# Patient Record
Sex: Male | Born: 1950 | Race: White | Hispanic: No | Marital: Single | Smoking: Never smoker
Health system: Southern US, Community
[De-identification: ages and names within clinical notes are randomized; demographics above are authoritative.]

## PROBLEM LIST (undated history)

## (undated) DIAGNOSIS — L570 Actinic keratosis: Secondary | ICD-10-CM

## (undated) HISTORY — DX: Actinic keratosis: L57.0

---

## 2009-05-09 ENCOUNTER — Ambulatory Visit: Payer: Self-pay | Admitting: Gastroenterology

## 2014-06-28 ENCOUNTER — Ambulatory Visit
Admit: 2014-06-28 | Disposition: A | Discharge status: Home / Self Care | Payer: Self-pay | Attending: General Practice | Admitting: General Practice

## 2019-04-16 ENCOUNTER — Telehealth: Payer: Self-pay | Admitting: Family Medicine

## 2019-04-16 NOTE — Telephone Encounter (Signed)
Copied from [place] [id number]. Topic: Appointment Scheduling - New Patient >> Apr 16, 2019  9:27 AM Greggory Keen D wrote: Pt's daughter Sharyn Creamer called to schedule her dad an appt with Dr. Wynetta Emery.  She is Dr. Elta Guadeloupe Crissman's step daughter and she said her brother sees Dr. Wynetta Emery and wants to get her dad established with her.  CB#  [phone]

## 2019-04-16 NOTE — Telephone Encounter (Signed)
That's fine

## 2019-04-16 NOTE — Telephone Encounter (Signed)
Scheduled

## 2019-04-30 ENCOUNTER — Encounter: Payer: Self-pay | Admitting: Family Medicine

## 2019-04-30 ENCOUNTER — Ambulatory Visit (INDEPENDENT_AMBULATORY_CARE_PROVIDER_SITE_OTHER): Payer: Medicare Other | Admitting: Family Medicine

## 2019-04-30 DIAGNOSIS — Z1329 Encounter for screening for other suspected endocrine disorder: Secondary | ICD-10-CM

## 2019-04-30 DIAGNOSIS — Z114 Encounter for screening for human immunodeficiency virus [HIV]: Secondary | ICD-10-CM

## 2019-04-30 DIAGNOSIS — Z125 Encounter for screening for malignant neoplasm of prostate: Secondary | ICD-10-CM

## 2019-04-30 DIAGNOSIS — Z131 Encounter for screening for diabetes mellitus: Secondary | ICD-10-CM | POA: Diagnosis not present

## 2019-04-30 DIAGNOSIS — R5383 Other fatigue: Secondary | ICD-10-CM

## 2019-04-30 DIAGNOSIS — Z13220 Encounter for screening for lipoid disorders: Secondary | ICD-10-CM | POA: Diagnosis not present

## 2019-04-30 DIAGNOSIS — Z130 Encounter for screening for diseases of the blood and blood-forming organs and certain disorders involving the immune mechanism: Secondary | ICD-10-CM

## 2019-04-30 DIAGNOSIS — Z1159 Encounter for screening for other viral diseases: Secondary | ICD-10-CM

## 2019-04-30 LAB — UA/M W/RFLX CULTURE, ROUTINE
Bilirubin, UA: NEGATIVE
Glucose, UA: NEGATIVE
Ketones, UA: NEGATIVE
Leukocytes,UA: NEGATIVE
Nitrite, UA: NEGATIVE
Protein,UA: NEGATIVE
RBC, UA: NEGATIVE
Specific Gravity, UA: 1.02 (ref 1.005–1.030)
Urobilinogen, Ur: 0.2 mg/dL (ref 0.2–1.0)
pH, UA: 7 (ref 5.0–7.5)

## 2019-04-30 LAB — BAYER DCA HB A1C WAIVED: HB A1C (BAYER DCA - WAIVED): 5.5 % (ref <7.0)
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
## 2019-05-05 LAB — LIPID PANEL W/O CHOL/HDL RATIO
Cholesterol, Total: 201 mg/dL — ABNORMAL HIGH (ref 100–199)
HDL: 61 mg/dL (ref >39)
LDL Chol Calc (NIH): 127 mg/dL — ABNORMAL HIGH (ref 0–99)
Triglycerides: 74 mg/dL (ref 0–149)
VLDL Cholesterol Cal: 13 mg/dL (ref 5–40)

## 2019-05-05 LAB — COMPREHENSIVE METABOLIC PANEL
ALT: 13 IU/L (ref 0–44)
AST: 17 IU/L (ref 0–40)
Albumin/Globulin Ratio: 2.2 (ref 1.2–2.2)
Albumin: 4.8 g/dL (ref 3.8–4.8)
Alkaline Phosphatase: 76 IU/L (ref 39–117)
BUN/Creatinine Ratio: 13 (ref 10–24)
BUN: 14 mg/dL (ref 8–27)
Bilirubin Total: 0.9 mg/dL (ref 0.0–1.2)
CO2: 23 mmol/L (ref 20–29)
Calcium: 9.3 mg/dL (ref 8.6–10.2)
Chloride: 99 mmol/L (ref 96–106)
Creatinine, Ser: 1.1 mg/dL (ref 0.76–1.27)
GFR calc Af Amer: 78 mL/min/{1.73_m2} (ref >59)
GFR calc non Af Amer: 68 mL/min/{1.73_m2} (ref >59)
Globulin, Total: 2.2 g/dL (ref 1.5–4.5)
Glucose: 90 mg/dL (ref 65–99)
Potassium: 4.8 mmol/L (ref 3.5–5.2)
Sodium: 138 mmol/L (ref 134–144)
Total Protein: 7 g/dL (ref 6.0–8.5)

## 2019-05-05 LAB — TSH: TSH: 2.89 u[IU]/mL (ref 0.450–4.500)

## 2019-05-05 LAB — CBC WITH DIFFERENTIAL/PLATELET
Basophils Absolute: 0.1 10*3/uL (ref 0.0–0.2)
Basos: 1 %
EOS (ABSOLUTE): 0.1 10*3/uL (ref 0.0–0.4)
Eos: 2 %
Hematocrit: 42.4 % (ref 37.5–51.0)
Hemoglobin: 14.7 g/dL (ref 13.0–17.7)
Immature Grans (Abs): 0 10*3/uL (ref 0.0–0.1)
Immature Granulocytes: 0 %
Lymphocytes Absolute: 2.5 10*3/uL (ref 0.7–3.1)
Lymphs: 38 %
MCH: 31 pg (ref 26.6–33.0)
MCHC: 34.7 g/dL (ref 31.5–35.7)
MCV: 90 fL (ref 79–97)
Monocytes Absolute: 0.5 10*3/uL (ref 0.1–0.9)
Monocytes: 7 %
Neutrophils Absolute: 3.4 10*3/uL (ref 1.4–7.0)
Neutrophils: 52 %
Platelets: 263 10*3/uL (ref 150–450)
RBC: 4.74 x10E6/uL (ref 4.14–5.80)
RDW: 12.3 % (ref 11.6–15.4)
WBC: 6.6 10*3/uL (ref 3.4–10.8)

## 2019-05-05 LAB — HEPATITIS C ANTIBODY: Hep C Virus Ab: <0.1 s/co ratio (ref 0.0–0.9)

## 2019-05-05 LAB — HIV ANTIBODY (ROUTINE TESTING W REFLEX): HIV Screen 4th Generation wRfx: NONREACTIVE

## 2019-05-05 LAB — PSA: Prostate Specific Ag, Serum: 1.1 ng/mL (ref 0.0–4.0)

## 2019-06-11 ENCOUNTER — Ambulatory Visit (INDEPENDENT_AMBULATORY_CARE_PROVIDER_SITE_OTHER): Payer: Medicare Other | Admitting: Family Medicine

## 2019-06-11 ENCOUNTER — Encounter: Payer: Self-pay | Admitting: Family Medicine

## 2019-06-11 ENCOUNTER — Other Ambulatory Visit: Payer: Self-pay

## 2019-06-11 VITALS — BP 134/80 | HR 61 | Temp 98.2°F | Ht 68.1 in | Wt 180.0 lb

## 2019-06-11 DIAGNOSIS — Z0000 Encounter for general adult medical examination without abnormal findings: Secondary | ICD-10-CM | POA: Diagnosis not present

## 2019-06-11 NOTE — Patient Instructions (Addendum)
Preventative Services:  Health Risk Assessment and Personalized Prevention Plan: Done today Bone Mass Measurements: N/A CVD Screening: Up to date Colon Cancer Screening:  Depression Screening: done today Diabetes Screening: up to date Glaucoma Screening: See your eye doctor Hepatitis B vaccine: N/A Hepatitis C screening: Up to date HIV Screening: up to date Flu Vaccine: up to date Lung cancer Screening: N/A Obesity Screening: Done today Pneumonia Vaccines (2): Will consider STI Screening: N/A PSA screening: Up to date  Health Maintenance After Age 58 After age 73, you are at a higher risk for certain long-term diseases and infections as well as injuries from falls. Falls are a major cause of broken bones and head injuries in people who are older than age 64. Getting regular preventive care can help to keep you healthy and well. Preventive care includes getting regular testing and making lifestyle changes as recommended by your health care provider. Talk with your health care provider about:  Which screenings and tests you should have. A screening is a test that checks for a disease when you have no symptoms.  A diet and exercise plan that is right for you. What should I know about screenings and tests to prevent falls? Screening and testing are the best ways to find a health problem early. Early diagnosis and treatment give you the best chance of managing medical conditions that are common after age 64. Certain conditions and lifestyle choices may make you more likely to have a fall. Your health care provider may recommend:  Regular vision checks. Poor vision and conditions such as cataracts can make you more likely to have a fall. If you wear glasses, make sure to get your prescription updated if your vision changes.  Medicine review. Work with your health care provider to regularly review all of the medicines you are taking, including over-the-counter medicines. Ask your health care  provider about any side effects that may make you more likely to have a fall. Tell your health care provider if any medicines that you take make you feel dizzy or sleepy.  Osteoporosis screening. Osteoporosis is a condition that causes the bones to get weaker. This can make the bones weak and cause them to break more easily.  Blood pressure screening. Blood pressure changes and medicines to control blood pressure can make you feel dizzy.  Strength and balance checks. Your health care provider may recommend certain tests to check your strength and balance while standing, walking, or changing positions.  Foot health exam. Foot pain and numbness, as well as not wearing proper footwear, can make you more likely to have a fall.  Depression screening. You may be more likely to have a fall if you have a fear of falling, feel emotionally low, or feel unable to do activities that you used to do.  Alcohol use screening. Using too much alcohol can affect your balance and may make you more likely to have a fall. What actions can I take to lower my risk of falls? General instructions  Talk with your health care provider about your risks for falling. Tell your health care provider if: ? You fall. Be sure to tell your health care provider about all falls, even ones that seem minor. ? You feel dizzy, sleepy, or off-balance.  Take over-the-counter and prescription medicines only as told by your health care provider. These include any supplements.  Eat a healthy diet and maintain a healthy weight. A healthy diet includes low-fat dairy products, low-fat (lean) meats, and fiber  from whole grains, beans, and lots of fruits and vegetables. Home safety  Remove any tripping hazards, such as rugs, cords, and clutter.  Install safety equipment such as grab bars in bathrooms and safety rails on stairs.  Keep rooms and walkways well-lit. Activity   Follow a regular exercise program to stay fit. This will help  you maintain your balance. Ask your health care provider what types of exercise are appropriate for you.  If you need a cane or walker, use it as recommended by your health care provider.  Wear supportive shoes that have nonskid soles. Lifestyle  Do not drink alcohol if your health care provider tells you not to drink.  If you drink alcohol, limit how much you have: ? 0-1 drink a day for women. ? 0-2 drinks a day for men.  Be aware of how much alcohol is in your drink. In the [place] one drink equals one typical bottle of beer (12 oz), one-half glass of wine (5 oz), or one shot of hard liquor (1 oz).  Do not use any products that contain nicotine or tobacco, such as cigarettes and e-cigarettes. If you need help quitting, ask your health care provider. Summary  Having a healthy lifestyle and getting preventive care can help to protect your health and wellness after age 87.  Screening and testing are the best way to find a health problem early and help you avoid having a fall. Early diagnosis and treatment give you the best chance for managing medical conditions that are more common for people who are older than age 57.  Falls are a major cause of broken bones and head injuries in people who are older than age 42. Take precautions to prevent a fall at home.  Work with your health care provider to learn what changes you can make to improve your health and wellness and to prevent falls. This information is not intended to replace advice given to you by your health care provider. Make sure you discuss any questions you have with your health care provider. Document Revised: 07/03/2018 Document Reviewed: 01/23/2017 Elsevier Patient Education  [place]. Pneumococcal Conjugate Vaccine (PCV13): What You Need to Know 1. Why get vaccinated? Pneumococcal conjugate vaccine (PCV13) can prevent pneumococcal disease. Pneumococcal disease refers to any illness caused by pneumococcal bacteria.  These bacteria can cause many types of illnesses, including pneumonia, which is an infection of the lungs. Pneumococcal bacteria are one of the most common causes of pneumonia. Besides pneumonia, pneumococcal bacteria can also cause:  Ear infections  Sinus infections  Meningitis (infection of the tissue covering the brain and spinal cord)  Bacteremia (bloodstream infection) Anyone can get pneumococcal disease, but children under 21 years of age, people with certain medical conditions, adults 42 years or older, and cigarette smokers are at the highest risk. Most pneumococcal infections are mild. However, some can result in long-term problems, such as brain damage or hearing loss. Meningitis, bacteremia, and pneumonia caused by pneumococcal disease can be fatal. 2. PCV13 PCV13 protects against 13 types of bacteria that cause pneumococcal disease. Infants and young children usually need 4 doses of pneumococcal conjugate vaccine, at 2, 4, 6, and 59-45 months of age. In some cases, a child might need fewer than 4 doses to complete PCV13 vaccination. A dose of PCV23 vaccine is also recommended for anyone 2 years or older with certain medical conditions if they did not already receive PCV13. This vaccine may be given to adults 73 years or older  based on discussions between the patient and health care provider. 3. Talk with your health care provider Tell your vaccine provider if the person getting the vaccine:  Has had an allergic reaction after a previous dose of PCV13, to an earlier pneumococcal conjugate vaccine known as PCV7, or to any vaccine containing diphtheria toxoid (for example, DTaP), or has any severe, life-threatening allergies.  In some cases, your health care provider may decide to postpone PCV13 vaccination to a future visit. People with minor illnesses, such as a cold, may be vaccinated. People who are moderately or severely ill should usually wait until they recover before getting  PCV13. Your health care provider can give you more information. 4. Risks of a vaccine reaction  Redness, swelling, pain, or tenderness where the shot is given, and fever, loss of appetite, fussiness (irritability), feeling tired, headache, and chills can happen after PCV13. Young children may be at increased risk for seizures caused by fever after PCV13 if it is administered at the same time as inactivated influenza vaccine. Ask your health care provider for more information. People sometimes faint after medical procedures, including vaccination. Tell your provider if you feel dizzy or have vision changes or ringing in the ears. As with any medicine, there is a very remote chance of a vaccine causing a severe allergic reaction, other serious injury, or death. 5. What if there is a serious problem? An allergic reaction could occur after the vaccinated person leaves the clinic. If you see signs of a severe allergic reaction (hives, swelling of the face and throat, difficulty breathing, a fast heartbeat, dizziness, or weakness), call 9-1-1 and get the person to the nearest hospital. For other signs that concern you, call your health care provider. Adverse reactions should be reported to the Vaccine Adverse Event Reporting System (VAERS). Your health care provider will usually file this report, or you can do it yourself. Visit the VAERS website at www.vaers.SamedayNews.es or call 417 312 3656. VAERS is only for reporting reactions, and VAERS staff do not give medical advice. 6. The National Vaccine Injury Compensation Program The [employer] Vaccine Injury Compensation Program (VICP) is a federal program that was created to compensate people who may have been injured by certain vaccines. Visit the VICP website at GoldCloset.com.ee or call 917 739 0129 to learn about the program and about filing a claim. There is a time limit to file a claim for compensation. 7. How can I learn more?  Ask your  health care provider.  Call your local or state health department.  Contact the Centers for Disease Control and Prevention (CDC): ? Call (510) 661-2956 (1-800-CDC-INFO) or ? Visit CDC's website at http://hunter.com/ Vaccine Information Statement PCV13 Vaccine (01/22/2018) This information is not intended to replace advice given to you by your health care provider. Make sure you discuss any questions you have with your health care provider. Document Revised: 07/01/2018 Document Reviewed: 10/22/2017 Elsevier Patient Education  [place]. Pneumococcal Polysaccharide Vaccine (PPSV23): What You Need to Know 1. Why get vaccinated? Pneumococcal polysaccharide vaccine (PPSV23) can prevent pneumococcal disease. Pneumococcal disease refers to any illness caused by pneumococcal bacteria. These bacteria can cause many types of illnesses, including pneumonia, which is an infection of the lungs. Pneumococcal bacteria are one of the most common causes of pneumonia. Besides pneumonia, pneumococcal bacteria can also cause:  Ear infections  Sinus infections  Meningitis (infection of the tissue covering the brain and spinal cord)  Bacteremia (bloodstream infection) Anyone can get pneumococcal disease, but children under 2 years of  age, people with certain medical conditions, adults 36 years or older, and cigarette smokers are at the highest risk. Most pneumococcal infections are mild. However, some can result in long-term problems, such as brain damage or hearing loss. Meningitis, bacteremia, and pneumonia caused by pneumococcal disease can be fatal. 2. PPSV23 PPSV23 protects against 23 types of bacteria that cause pneumococcal disease. PPSV23 is recommended for:  All adults 20 years or older,  Anyone 2 years or older with certain medical conditions that can lead to an increased risk for pneumococcal disease. Most people need only one dose of PPSV23. A second dose of PPSV23, and another type  of pneumococcal vaccine called PCV13, are recommended for certain high-risk groups. Your health care provider can give you more information. People 65 years or older should get a dose of PPSV23 even if they have already gotten one or more doses of the vaccine before they turned 61. 3. Talk with your health care provider Tell your vaccine provider if the person getting the vaccine:  Has had an allergic reaction after a previous dose of PPSV23, or has any severe, life-threatening allergies. In some cases, your health care provider may decide to postpone PPSV23 vaccination to a future visit. People with minor illnesses, such as a cold, may be vaccinated. People who are moderately or severely ill should usually wait until they recover before getting PPSV23. Your health care provider can give you more information. 4. Risks of a vaccine reaction  Redness or pain where the shot is given, feeling tired, fever, or muscle aches can happen after PPSV23. People sometimes faint after medical procedures, including vaccination. Tell your provider if you feel dizzy or have vision changes or ringing in the ears. As with any medicine, there is a very remote chance of a vaccine causing a severe allergic reaction, other serious injury, or death. 5. What if there is a serious problem? An allergic reaction could occur after the vaccinated person leaves the clinic. If you see signs of a severe allergic reaction (hives, swelling of the face and throat, difficulty breathing, a fast heartbeat, dizziness, or weakness), call 9-1-1 and get the person to the nearest hospital. For other signs that concern you, call your health care provider. Adverse reactions should be reported to the Vaccine Adverse Event Reporting System (VAERS). Your health care provider will usually file this report, or you can do it yourself. Visit the VAERS website at www.vaers.SamedayNews.es or call (864)448-2825. VAERS is only for reporting reactions, and VAERS  staff do not give medical advice. 6. How can I learn more?  Ask your health care provider.  Call your local or state health department.  Contact the Centers for Disease Control and Prevention (CDC): ? Call (616)400-6133 (1-800-CDC-INFO) or ? Visit CDC's website at http://hunter.com/ CDC Vaccine Information Statement PPSV23 Vaccine (01/22/2018) This information is not intended to replace advice given to you by your health care provider. Make sure you discuss any questions you have with your health care provider. Document Revised: 07/01/2018 Document Reviewed: 10/22/2017 Elsevier Patient Education  [place].

## 2019-06-11 NOTE — Progress Notes (Signed)
BP 134/80   Pulse 61   Temp 98.2 F (36.8 C) (Oral)   Ht 5' 8.1" (1.73 m)   Wt 180 lb (81.6 kg)   SpO2 97%   BMI 27.29 kg/m    Subjective:    Patient ID: Joshua Bass, male    DOB: [birth year], 69 y.o.   MRN: [id number]  HPI: Joshua Bass is a 69 y.o. male presenting on 06/11/2019 for comprehensive medical examination. Current medical complaints include:none  Interim Problems from his last visit: no  Functional Status Survey: Is the patient deaf or have difficulty hearing?: No Does the patient have difficulty seeing, even when wearing glasses/contacts?: No Does the patient have difficulty concentrating, remembering, or making decisions?: No Does the patient have difficulty walking or climbing stairs?: No Does the patient have difficulty dressing or bathing?: No Does the patient have difficulty doing errands alone such as visiting a doctor's office or shopping?: No  FALL RISK: Fall Risk  06/11/2019 04/30/2019  Falls in the past year? 1 0  Number falls in past yr: 0 0  Injury with Fall? 0 0    Depression Screen Depression screen [facility] 2/9 06/11/2019 04/30/2019  Decreased Interest 0 0  Down, Depressed, Hopeless 0 0  PHQ - 2 Score 0 0  Altered sleeping 0 -  Tired, decreased energy 0 -  Change in appetite 0 -  Feeling bad or failure about yourself  0 -  Trouble concentrating 0 -  Moving slowly or fidgety/restless 0 -  Suicidal thoughts 0 -  PHQ-9 Score 0 -  Difficult doing work/chores Not difficult at all -   Advanced Directives <no information>  Past Medical History:  History reviewed. No pertinent past medical history.  Surgical History:  History reviewed. No pertinent surgical history.  Medications:  No current outpatient medications on file prior to visit.   No current facility-administered medications on file prior to visit.    Allergies:  No Known Allergies  Social History:  Social History   Socioeconomic History  . Marital status: Married     Spouse name: Not on file  . Number of children: Not on file  . Years of education: Not on file  . Highest education level: Not on file  Occupational History  . Not on file  Tobacco Use  . Smoking status: Never Smoker  . Smokeless tobacco: Never Used  Substance and Sexual Activity  . Alcohol use: Never  . Drug use: Never  . Sexual activity: Not Currently  Other Topics Concern  . Not on file  Social History Narrative  . Not on file   Social Determinants of Health   Financial Resource Strain:   . Difficulty of Paying Living Expenses:   Food Insecurity:   . Worried About Charity fundraiser in the Last Year:   . Arboriculturist in the Last Year:   Transportation Needs:   . Film/video editor (Medical):   Marland Kitchen Lack of Transportation (Non-Medical):   Physical Activity:   . Days of Exercise per Week:   . Minutes of Exercise per Session:   Stress:   . Feeling of Stress :   Social Connections:   . Frequency of Communication with Friends and Family:   . Frequency of Social Gatherings with Friends and Family:   . Attends Religious Services:   . Active Member of Clubs or Organizations:   . Attends Archivist Meetings:   Marland Kitchen Marital Status:   Intimate Production manager  Violence:   . Fear of Current or Ex-Partner:   . Emotionally Abused:   Marland Kitchen Physically Abused:   . Sexually Abused:    Social History   Tobacco Use  Smoking Status Never Smoker  Smokeless Tobacco Never Used   Social History   Substance and Sexual Activity  Alcohol Use Never    Family History:  Family History  Problem Relation Age of Onset  . Alzheimer's disease Mother     Past medical history, surgical history, medications, allergies, family history and social history reviewed with patient today and changes made to appropriate areas of the chart.   Review of Systems  Constitutional: Negative.   HENT: Negative.   Eyes: Negative.   Respiratory: Negative.   Cardiovascular: Negative.    Gastrointestinal: Negative.   Genitourinary: Negative.   Musculoskeletal: Positive for joint pain. Negative for back pain, falls, myalgias and neck pain.  Skin: Negative.   Neurological: Negative.   Endo/Heme/Allergies: Negative.   Psychiatric/Behavioral: Negative.     All other ROS negative except what is listed above and in the HPI.      Objective:    BP 134/80   Pulse 61   Temp 98.2 F (36.8 C) (Oral)   Ht 5' 8.1" (1.73 m)   Wt 180 lb (81.6 kg)   SpO2 97%   BMI 27.29 kg/m   Wt Readings from Last 3 Encounters:  06/11/19 180 lb (81.6 kg)    Physical Exam Vitals and nursing note reviewed.  Constitutional:      General: He is not in acute distress.    Appearance: Normal appearance. He is normal weight. He is not ill-appearing, toxic-appearing or diaphoretic.  HENT:     Head: Normocephalic and atraumatic.     Right Ear: Tympanic membrane, ear canal and external ear normal. There is no impacted cerumen.     Left Ear: Tympanic membrane, ear canal and external ear normal. There is no impacted cerumen.     Nose: Nose normal. No congestion or rhinorrhea.     Mouth/Throat:     Mouth: Mucous membranes are moist.     Pharynx: Oropharynx is clear. No oropharyngeal exudate or posterior oropharyngeal erythema.  Eyes:     General: No scleral icterus.       Right eye: No discharge.        Left eye: No discharge.     Extraocular Movements: Extraocular movements intact.     Conjunctiva/sclera: Conjunctivae normal.     Pupils: Pupils are equal, round, and reactive to light.  Neck:     Vascular: No carotid bruit.  Cardiovascular:     Rate and Rhythm: Normal rate and regular rhythm.     Pulses: Normal pulses.     Heart sounds: No murmur. No friction rub. No gallop.   Pulmonary:     Effort: Pulmonary effort is normal. No respiratory distress.     Breath sounds: Normal breath sounds. No stridor. No wheezing, rhonchi or rales.  Chest:     Chest wall: No tenderness.  Abdominal:      General: Abdomen is flat. Bowel sounds are normal. There is no distension.     Palpations: Abdomen is soft. There is no mass.     Tenderness: There is no abdominal tenderness. There is no right CVA tenderness, left CVA tenderness, guarding or rebound.     Hernia: No hernia is present.  Genitourinary:    Comments: Genital exam deferred with shared decision making Musculoskeletal:  General: No swelling, tenderness, deformity or signs of injury.     Cervical back: Normal range of motion and neck supple. No rigidity. No muscular tenderness.     Right lower leg: No edema.     Left lower leg: No edema.  Lymphadenopathy:     Cervical: No cervical adenopathy.  Skin:    General: Skin is warm and dry.     Capillary Refill: Capillary refill takes less than 2 seconds.     Coloration: Skin is not jaundiced or pale.     Findings: No bruising, erythema, lesion or rash.  Neurological:     General: No focal deficit present.     Mental Status: He is alert and oriented to person, place, and time.     Cranial Nerves: No cranial nerve deficit.     Sensory: No sensory deficit.     Motor: No weakness.     Coordination: Coordination normal.     Gait: Gait normal.     Deep Tendon Reflexes: Reflexes normal.  Psychiatric:        Mood and Affect: Mood normal.        Behavior: Behavior normal.        Thought Content: Thought content normal.        Judgment: Judgment normal.     6CIT Screen 06/11/2019  What Year? 0 points  What month? 0 points  What time? 0 points  Count back from 20 0 points  Months in reverse 0 points  Repeat phrase 2 points  Total Score 2      Results for orders placed or performed in visit on 04/30/19  Hepatitis C Antibody  Result Value Ref Range   Hep C Virus Ab <0.1 0.0 - 0.9 s/co ratio  UA/M w/rflx Culture, Routine   Specimen: Urine   URINE  Result Value Ref Range   Specific Gravity, UA 1.020 1.005 - 1.030   pH, UA 7.0 5.0 - 7.5   Color, UA Yellow Yellow    Appearance Ur Clear Clear   Leukocytes,UA Negative Negative   Protein,UA Negative Negative/Trace   Glucose, UA Negative Negative   Ketones, UA Negative Negative   RBC, UA Negative Negative   Bilirubin, UA Negative Negative   Urobilinogen, Ur 0.2 0.2 - 1.0 mg/dL   Nitrite, UA Negative Negative  TSH  Result Value Ref Range   TSH 2.890 0.450 - 4.500 uIU/mL  PSA  Result Value Ref Range   Prostate Specific Ag, Serum 1.1 0.0 - 4.0 ng/mL  Lipid Panel w/o Chol/HDL Ratio  Result Value Ref Range   Cholesterol, Total 201 (H) 100 - 199 mg/dL   Triglycerides 74 0 - 149 mg/dL   HDL 61 >39 mg/dL   VLDL Cholesterol Cal 13 5 - 40 mg/dL   LDL Chol Calc (NIH) 127 (H) 0 - 99 mg/dL  HIV Antibody (routine testing w rflx)  Result Value Ref Range   HIV Screen 4th Generation wRfx Non Reactive Non Reactive  Comprehensive metabolic panel  Result Value Ref Range   Glucose 90 65 - 99 mg/dL   BUN 14 8 - 27 mg/dL   Creatinine, Ser 1.10 0.76 - 1.27 mg/dL   GFR calc non Af Amer 68 >59 mL/min/1.73   GFR calc Af Amer 78 >59 mL/min/1.73   BUN/Creatinine Ratio 13 10 - 24   Sodium 138 134 - 144 mmol/L   Potassium 4.8 3.5 - 5.2 mmol/L   Chloride 99 96 - 106 mmol/L   CO2 23  20 - 29 mmol/L   Calcium 9.3 8.6 - 10.2 mg/dL   Total Protein 7.0 6.0 - 8.5 g/dL   Albumin 4.8 3.8 - 4.8 g/dL   Globulin, Total 2.2 1.5 - 4.5 g/dL   Albumin/Globulin Ratio 2.2 1.2 - 2.2   Bilirubin Total 0.9 0.0 - 1.2 mg/dL   Alkaline Phosphatase 76 39 - 117 IU/L   AST 17 0 - 40 IU/L   ALT 13 0 - 44 IU/L  CBC with Differential/Platelet  Result Value Ref Range   WBC 6.6 3.4 - 10.8 x10E3/uL   RBC 4.74 4.14 - 5.80 x10E6/uL   Hemoglobin 14.7 13.0 - 17.7 g/dL   Hematocrit 42.4 37.5 - 51.0 %   MCV 90 79 - 97 fL   MCH 31.0 26.6 - 33.0 pg   MCHC 34.7 31.5 - 35.7 g/dL   RDW 12.3 11.6 - 15.4 %   Platelets 263 150 - 450 x10E3/uL   Neutrophils 52 Not Estab. %   Lymphs 38 Not Estab. %   Monocytes 7 Not Estab. %   Eos 2 Not Estab. %    Basos 1 Not Estab. %   Neutrophils Absolute 3.4 1.4 - 7.0 x10E3/uL   Lymphocytes Absolute 2.5 0.7 - 3.1 x10E3/uL   Monocytes Absolute 0.5 0.1 - 0.9 x10E3/uL   EOS (ABSOLUTE) 0.1 0.0 - 0.4 x10E3/uL   Basophils Absolute 0.1 0.0 - 0.2 x10E3/uL   Immature Granulocytes 0 Not Estab. %   Immature Grans (Abs) 0.0 0.0 - 0.1 x10E3/uL  Bayer DCA Hb A1c Waived  Result Value Ref Range   HB A1C (BAYER DCA - WAIVED) 5.5 <7.0 %      Assessment & Plan:   Problem List Items Addressed This Visit    None    Visit Diagnoses    Encounter for annual wellness exam in Medicare patient    -  Primary   Preventative care discussed today as below.        Preventative Services:  Health Risk Assessment and Personalized Prevention Plan: Done today Bone Mass Measurements: N/A CVD Screening: Up to date Colon Cancer Screening:  Depression Screening: done today Diabetes Screening: up to date Glaucoma Screening: See your eye doctor Hepatitis B vaccine: N/A Hepatitis C screening: Up to date HIV Screening: up to date Flu Vaccine: up to date Lung cancer Screening: N/A Obesity Screening: Done today Pneumonia Vaccines (2): Will consider STI Screening: N/A PSA screening: Up to date  LABORATORY TESTING:  Health maintenance labs ordered today as discussed above.   IMMUNIZATIONS:   - Tdap: Tetanus vaccination status reviewed: Can't get it due to COVID vaccine - Influenza: Administered today - Pneumovax: Can't get it due to COVID vaccine - Prevnar: Can't get it due to COVID vaccine  SCREENING: - Colonoscopy: Refused  Discussed with patient purpose of the colonoscopy is to detect colon cancer at curable precancerous or early stages   PATIENT COUNSELING:    Sexuality: Discussed sexually transmitted diseases, partner selection, use of condoms, avoidance of unintended pregnancy  and contraceptive alternatives.   Advised to avoid cigarette smoking.  I discussed with the patient that most people either  abstain from alcohol or drink within safe limits (<=14/week and <=4 drinks/occasion for males, <=7/weeks and <= 3 drinks/occasion for females) and that the risk for alcohol disorders and other health effects rises proportionally with the number of drinks per week and how often a drinker exceeds daily limits.  Discussed cessation/primary prevention of drug use and availability of treatment for  abuse.   Diet: Encouraged to adjust caloric intake to maintain  or achieve ideal body weight, to reduce intake of dietary saturated fat and total fat, to limit sodium intake by avoiding high sodium foods and not adding table salt, and to maintain adequate dietary potassium and calcium preferably from fresh fruits, vegetables, and low-fat dairy products.    stressed the importance of regular exercise  Injury prevention: Discussed safety belts, safety helmets, smoke detector, smoking near bedding or upholstery.   Dental health: Discussed importance of regular tooth brushing, flossing, and dental visits.   Follow up plan: NEXT PREVENTATIVE PHYSICAL DUE IN 1 YEAR. Return in about 1 year (around 06/10/2020) for Wellness.

## 2019-10-09 ENCOUNTER — Telehealth: Payer: Self-pay | Admitting: Family Medicine

## 2019-10-09 DIAGNOSIS — L989 Disorder of the skin and subcutaneous tissue, unspecified: Secondary | ICD-10-CM

## 2019-10-09 NOTE — Telephone Encounter (Signed)
Copied from [place] [id number]. Topic: General - Other >> Oct 07, 2019 10:41 AM Marya Landry D wrote: Reason for CRM: Patient is requesting to be referred to a dermatologist for a growth on his scalp.please advise >> Oct 09, 2019 10:09 AM Alanda Slim E wrote: Pt called and asked to speak with Dr. Wynetta Emery / he would like her opinion on something/ I asked Pt which dermatologist he wanted to see and stated he didn't have a particular one / Pt asked for Dr. Wynetta Emery to call him today

## 2019-10-12 ENCOUNTER — Ambulatory Visit (INDEPENDENT_AMBULATORY_CARE_PROVIDER_SITE_OTHER): Payer: Medicare Other | Admitting: Dermatology

## 2019-10-12 ENCOUNTER — Other Ambulatory Visit: Payer: Self-pay

## 2019-10-12 DIAGNOSIS — L578 Other skin changes due to chronic exposure to nonionizing radiation: Secondary | ICD-10-CM

## 2019-10-12 DIAGNOSIS — L7211 Pilar cyst: Secondary | ICD-10-CM

## 2019-10-12 DIAGNOSIS — L820 Inflamed seborrheic keratosis: Secondary | ICD-10-CM | POA: Diagnosis not present

## 2019-10-12 NOTE — Patient Instructions (Addendum)
Recommend daily broad spectrum sunscreen SPF 30+ to sun-exposed areas, reapply every 2 hours as needed. Call for new or changing lesions. Prior to procedure, discussed risks of blister formation, small wound, skin dyspigmentation, or rare scar following cryotherapy.  Liquid nitrogen was applied for 10-12 seconds to the skin lesion and the expected blistering or scabbing reaction explained. Do not pick at the area. Patient reminded to expect hypopigmented scars from the procedure. Return if lesion fails to fully resolve.  Cryotherapy Aftercare  . Wash gently with soap and water everyday.   . Apply Vaseline and Band-Aid daily until healed.   Pre-Operative Instructions  You are scheduled for a surgical procedure at [facility]. We recommend you read the following instructions. If you have any questions or concerns, please call the office at [phone].  1. Shower and wash the entire body with soap and water the day of your surgery paying special attention to cleansing at and around the planned surgery site.  2. Avoid aspirin or aspirin containing products at least fourteen (14) days prior to your surgical procedure and for at least one week (7 Days) after your surgical procedure. If you take aspirin on a regular basis for heart disease or history of stroke or for any other reason, we may recommend you continue taking aspirin but please notify us if you take this on a regular basis. Aspirin can cause more bleeding to occur during surgery as well as prolonged bleeding and bruising after surgery.   3. Avoid other nonsteroidal pain medications at least one week prior to surgery and at least one week prior to your surgery. These include medications such as Ibuprofen (Motrin, Advil and Nuprin), Naprosyn, Voltaren, Relafen, etc. If medications are used for therapeutic reasons, please inform us as they can cause increased bleeding or prolonged bleeding during and bruising after surgical procedures.    4. Please advice us if you are taking any "blood thinner" medications such as Coumadin or Dipyridamole or Plavix or similar medications. These cause increased bleeding and prolonged bleeding during and bruising after surgical procedures. We may have to consider discontinuing these medications briefly prior to and shortly after your surgery, if safe to do so.   5. Please inform us of all medications you are currently taking. All medications that are taken regularly should be taken the day of surgery as you always do. Nevertheless, we need to be informed of what medications you are taking prior to surgery to whether they will affect the procedure or cause any complications.   6. Please inform us of any medication allergies. Also inform us of whether you have allergies to Latex or rubber products or whether you have had any adverse reaction to Lidocaine or Epinephrine.  7. Please inform us of any prosthetic or artificial body parts such as artificial heart valve, joint replacements, etc., or similar condition that might require preoperative antibiotics.   8. We recommend avoidance of alcohol at least two weeks prior to surgery and continued avoidence for at least two weeks after surgery.   9. We recommend discontinuation of tobacco smoking at least two weeks prior to surgery and continued abstinence for at least two weeks after surgery.  10. Do not plan strenuous exercise, strenuous work or strenuous lifting for approximately four weeks after your surgery.   11. We request if you are unable to make your scheduled surgical appointment, please call us at least a week in advance or as soon as you are aware of a problem   sot aht we can cancel or reschedule you.   12. You MAKE TAKE TYLENOL (acetaminophen) for pain as it is not a blood thinner.   13. PLEASE PLAN TO BE IN TOWN FOR TWO WEEKS FOLLOWING SURGERY, THIS IS IMPORTANT SO YOU CAN BE CHECKED FOR DRESSING CHANGES, SUTURE REMOVAL AND TO MONITOR FOR  POSSIBLE COMPLICATIONS. 

## 2019-10-12 NOTE — Progress Notes (Signed)
   Follow-Up Visit   Subjective  Joshua Bass is a 69 y.o. male who presents for the following: Area of concern (back of head, face and hands).  Patient presents today for a few areas of concern on his scalp, face and hands that he would like to have evaluated today  The following portions of the chart were reviewed this encounter and updated as appropriate:  Tobacco  Allergies  Meds  Problems  Med Hx  Surg Hx  Fam Hx     Review of Systems:  No other skin or systemic complaints except as noted in HPI or Assessment and Plan.  Objective  Well appearing patient in no apparent distress; mood and affect are within normal limits.  A focused examination was performed including Face, hands and Scalp. Relevant physical exam findings are noted in the Assessment and Plan.  Objective  Right posterior scalp medial to mastoid: Subcutaneous nodule at scalp.   Objective  Right Dorsal Hand x 6 (6): Erythematous keratotic or waxy stuck-on papule or plaque.    Left Cheek x 1: 2.2 cm Left Cheek Erythematous keratotic or waxy stuck-on papule or plaque.    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  Discussed surgical excision to remove, including resulting scar and possible recurrence.  Patient will schedule for surgery. Pre-op information given.   Inflamed seborrheic keratosis (7) Right Dorsal Hand x 6 (6); Left Cheek x 1  Cryotherapy today Prior to procedure, discussed risks of blister formation, small wound, skin dyspigmentation, or rare scar following cryotherapy.    Destruction of lesion - Left Cheek x 1, Right Dorsal Hand x 6 Complexity: simple   Destruction method: cryotherapy   Informed consent: discussed and consent obtained   Timeout:  patient name, date of birth, surgical site, and procedure verified Lesion destroyed using liquid nitrogen: Yes   Region frozen until ice ball extended beyond  lesion: Yes   Outcome: patient tolerated procedure well with no complications   Post-procedure details: wound care instructions given    Actinic Damage - diffuse scaly erythematous macules with underlying dyspigmentation - Recommend daily broad spectrum sunscreen SPF 30+ to sun-exposed areas, reapply every 2 hours as needed.  - Call for new or changing lesions.  Return for AK follow up at surgery date.  Marene Lenz, CMA, am acting as scribe for Sarina Ser, MD . Documentation: I have reviewed the above documentation for accuracy and completeness, and I agree with the above.  Sarina Ser, MD

## 2019-10-13 ENCOUNTER — Encounter: Payer: Self-pay | Admitting: Dermatology

## 2019-10-20 ENCOUNTER — Ambulatory Visit (INDEPENDENT_AMBULATORY_CARE_PROVIDER_SITE_OTHER): Payer: Medicare Other | Admitting: Dermatology

## 2019-10-20 ENCOUNTER — Telehealth: Payer: Self-pay

## 2019-10-20 ENCOUNTER — Other Ambulatory Visit: Payer: Self-pay

## 2019-10-20 DIAGNOSIS — L820 Inflamed seborrheic keratosis: Secondary | ICD-10-CM

## 2019-10-20 DIAGNOSIS — L7211 Pilar cyst: Secondary | ICD-10-CM | POA: Diagnosis not present

## 2019-10-20 DIAGNOSIS — L578 Other skin changes due to chronic exposure to nonionizing radiation: Secondary | ICD-10-CM | POA: Diagnosis not present

## 2019-10-20 NOTE — Telephone Encounter (Signed)
Pt doing fine after today's surgery.  Discussed pain regimen that was given to him in his AVS.  Advised pt to call if he had any problems/concerns, otherwise we would see him next week at his f/u appt./sh

## 2019-10-20 NOTE — Progress Notes (Signed)
Follow-Up Visit   Subjective  Joshua Bass is a 69 y.o. male who presents for the following: Cyst (right post scalp medial to mastoid - Excise today). He also complains of a spots on his face and shoulder that are irritating and for which he would like treatment.  The following portions of the chart were reviewed this encounter and updated as appropriate:  Tobacco  Allergies  Meds  Problems  Med Hx  Surg Hx  Fam Hx     Review of Systems:  No other skin or systemic complaints except as noted in HPI or Assessment and Plan.  Objective  Well appearing patient in no apparent distress; mood and affect are within normal limits.  A focused examination was performed including scalp, trunk. Relevant physical exam findings are noted in the Assessment and Plan.  Objective  Right post scalp medial to mastoid: Subcutaneous nodule  Objective  Left Temple/forehead x 5, left post shoulder x 1 (6): Erythematous keratotic or waxy stuck-on papule or plaque.    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  Mupirocin daily with dressing changes - patient has at home  Skin excision - Right post scalp medial to mastoid  Lesion length (cm):  1.5 Lesion width (cm):  1 Margin per side (cm):  0 Total excision diameter (cm):  1.5 Informed consent: discussed and consent obtained   Timeout: patient name, date of birth, surgical site, and procedure verified   Procedure prep:  Patient was prepped and draped in usual sterile fashion Prep type:  Isopropyl alcohol and povidone-iodine Anesthesia: the lesion was anesthetized in a standard fashion   Anesthetic:  1% lidocaine w/ epinephrine 1-100,000 buffered w/ 8.4% NaHCO3 Instrument used: #15 blade   Hemostasis achieved with: suture, pressure and electrodesiccation   Hemostasis achieved with comment:  Electrocautery Outcome: patient tolerated procedure well with no complications   Post-procedure details: sterile dressing  applied and wound care instructions given   Dressing type: bandage and pressure dressing (mupirocin)    Skin repair - Right post scalp medial to mastoid Complexity:  Complex Final length (cm):  2 Reason for type of repair: reduce tension to allow closure, reduce the risk of dehiscence, infection, and necrosis, reduce subcutaneous dead space and avoid a hematoma, allow closure of the large defect, preserve normal anatomy, preserve normal anatomical and functional relationships and enhance both functionality and cosmetic results   Undermining: area extensively undermined   Undermining comment:  Undermining defect - 1.1 cm Subcutaneous layers (deep stitches):  Suture size:  3-0 Suture type: Vicryl (polyglactin 910)   Subcutaneous suture technique: inverted dermal. Fine/surface layer approximation (top stitches):  Suture size:  3-0 Suture type: nylon   Stitches: simple running   Suture removal (days):  7 Hemostasis achieved with: suture and pressure Outcome: patient tolerated procedure well with no complications   Post-procedure details: sterile dressing applied and wound care instructions given   Dressing type: bandage and pressure dressing (mupirocin)    Specimen 1 - Surgical pathology Differential Diagnosis: Cyst vs other  Check Margins: No Subcutaneous nodule  Inflamed seborrheic keratosis (6) Left Temple/forehead x 5, left post shoulder x 1  Destruction of lesion - Left Temple/forehead x 5, left post shoulder x 1 Complexity: simple   Destruction method: cryotherapy   Informed consent: discussed and consent obtained   Timeout:  patient name, date of birth, surgical site, and procedure verified Lesion destroyed using liquid nitrogen: Yes   Region frozen  until ice ball extended beyond lesion: Yes   Outcome: patient tolerated procedure well with no complications   Post-procedure details: wound care instructions given    Actinic Damage - diffuse scaly erythematous macules with  underlying dyspigmentation - Recommend daily broad spectrum sunscreen SPF 30+ to sun-exposed areas, reapply every 2 hours as needed.  - Call for new or changing lesions.  Return in about 1 week (around 10/27/2019).   I, Ashok Cordia, CMA, am acting as scribe for Sarina Ser, MD .  Documentation: I have reviewed the above documentation for accuracy and completeness, and I agree with the above.  Sarina Ser, MD

## 2019-10-20 NOTE — Patient Instructions (Signed)

## 2019-10-23 ENCOUNTER — Encounter: Payer: Self-pay | Admitting: Dermatology

## 2019-10-27 ENCOUNTER — Other Ambulatory Visit: Payer: Self-pay

## 2019-10-27 ENCOUNTER — Ambulatory Visit (INDEPENDENT_AMBULATORY_CARE_PROVIDER_SITE_OTHER): Payer: Medicare Other | Admitting: Dermatology

## 2019-10-27 DIAGNOSIS — D485 Neoplasm of uncertain behavior of skin: Secondary | ICD-10-CM

## 2019-10-27 DIAGNOSIS — Z4802 Encounter for removal of sutures: Secondary | ICD-10-CM

## 2019-10-27 NOTE — Progress Notes (Signed)
   Follow-Up Visit   Subjective  Joshua Bass is a 69 y.o. male who presents for the following: post op (patient is here today for suture removal).  The following portions of the chart were reviewed this encounter and updated as appropriate:  Tobacco  Allergies  Meds  Problems  Med Hx  Surg Hx  Fam Hx      Review of Systems:  No other skin or systemic complaints except as noted in HPI or Assessment and Plan.  Objective  Well appearing patient in no apparent distress; mood and affect are within normal limits.  A focused examination was performed including the scalp. Relevant physical exam findings are noted in the Assessment and Plan.  Objective  R post scalp med to mastoid: Healing excision site   Assessment & Plan  Neoplasm of uncertain behavior of skin - cyst of posterior scalp - path is pending. R post scalp med to mastoid  Encounter for Removal of Sutures - Incision site at the R post scalp med to mastoid is clean, dry and intact - Wound cleansed, sutures removed, wound cleansed and steri strips applied.  - Will discuss pathology results with patient when available. - Patient advised to keep steri-strips dry until they fall off. - Scars remodel for a full year. - Once steri-strips fall off, patient can apply over-the-counter silicone scar cream each night to help with scar remodeling if desired. - Patient advised to call with any concerns or if they notice any new or changing lesions.   Return in about 3 months (around 01/27/2020) for AK recheck.  Luther Redo, CMA, am acting as scribe for Sarina Ser, MD .  Documentation: I have reviewed the above documentation for accuracy and completeness, and I agree with the above.  Sarina Ser, MD

## 2019-10-28 ENCOUNTER — Encounter: Payer: Self-pay | Admitting: Dermatology

## 2019-10-29 ENCOUNTER — Telehealth: Payer: Self-pay

## 2019-10-29 NOTE — Telephone Encounter (Signed)
Patient advised of biopsy results.

## 2019-10-29 NOTE — Telephone Encounter (Signed)
-----   Message from Ralene Bathe, MD sent at 10/29/2019 11:41 AM EDT ----- Skin (M), right post scalp medial to mastoid EXCISION, EPIDERMOID CYST, MARGINS FREE  Benign cyst

## 2019-10-29 NOTE — Telephone Encounter (Signed)
Left message on voicemail to return my call.  

## 2020-02-01 ENCOUNTER — Encounter: Payer: Self-pay | Admitting: Dermatology

## 2020-02-01 ENCOUNTER — Ambulatory Visit (INDEPENDENT_AMBULATORY_CARE_PROVIDER_SITE_OTHER): Payer: Medicare Other | Admitting: Dermatology

## 2020-02-01 ENCOUNTER — Other Ambulatory Visit: Payer: Self-pay

## 2020-02-01 DIAGNOSIS — L820 Inflamed seborrheic keratosis: Secondary | ICD-10-CM

## 2020-02-01 DIAGNOSIS — L814 Other melanin hyperpigmentation: Secondary | ICD-10-CM

## 2020-02-01 DIAGNOSIS — L578 Other skin changes due to chronic exposure to nonionizing radiation: Secondary | ICD-10-CM

## 2020-02-01 DIAGNOSIS — D229 Melanocytic nevi, unspecified: Secondary | ICD-10-CM

## 2020-02-01 DIAGNOSIS — Z1283 Encounter for screening for malignant neoplasm of skin: Secondary | ICD-10-CM

## 2020-02-01 DIAGNOSIS — D1800 Hemangioma unspecified site: Secondary | ICD-10-CM

## 2020-02-01 DIAGNOSIS — L821 Other seborrheic keratosis: Secondary | ICD-10-CM

## 2020-02-01 DIAGNOSIS — D692 Other nonthrombocytopenic purpura: Secondary | ICD-10-CM

## 2020-02-01 DIAGNOSIS — L570 Actinic keratosis: Secondary | ICD-10-CM

## 2020-02-01 NOTE — Patient Instructions (Signed)
Cryotherapy Aftercare  . Wash gently with soap and water everyday.   . Apply Vaseline and Band-Aid daily until healed.  

## 2020-02-01 NOTE — Progress Notes (Signed)
Follow-Up Visit   Subjective  Joshua Bass is a 69 y.o. male who presents for the following: Follow-up (3 months f/u on irritated moles on the scalp, ears, hands) and Annual Exam (UBSE ). No history of skin cancers.  The patient presents for Upper Body Skin Exam (UBSE) for skin cancer screening and mole check.  The following portions of the chart were reviewed this encounter and updated as appropriate:  Tobacco  Allergies  Meds  Problems  Med Hx  Surg Hx  Fam Hx     Review of Systems:  No other skin or systemic complaints except as noted in HPI or Assessment and Plan.  Objective  Well appearing patient in no apparent distress; mood and affect are within normal limits.  All skin waist up examined.  Objective  Scalp x 5, face x 10 (15): Erythematous thin papules/macules with gritty scale.   Objective  Mid Back x 6, L arm x 1, R clavicle x 3, abdomen x 1, R arm x 5, L forearm x 1, L temple x 1 (18): Erythematous keratotic or waxy stuck-on papule or plaque.    Assessment & Plan  AK (actinic keratosis) (15) Scalp x 5, face x 10  Destruction of lesion - Scalp x 5, face x 10 Complexity: simple   Destruction method: cryotherapy   Informed consent: discussed and consent obtained   Timeout:  patient name, date of birth, surgical site, and procedure verified Lesion destroyed using liquid nitrogen: Yes   Region frozen until ice ball extended beyond lesion: Yes   Outcome: patient tolerated procedure well with no complications   Post-procedure details: wound care instructions given    Inflamed seborrheic keratosis (18) Mid Back x 6, L arm x 1, R clavicle x 3, abdomen x 1, R arm x 5, L forearm x 1, L temple x 1  Destruction of lesion - Mid Back x 6, L arm x 1, R clavicle x 3, abdomen x 1, R arm x 5, L forearm x 1, L temple x 1 Complexity: simple   Destruction method: cryotherapy   Informed consent: discussed and consent obtained   Timeout:  patient name, date of birth,  surgical site, and procedure verified Lesion destroyed using liquid nitrogen: Yes   Region frozen until ice ball extended beyond lesion: Yes   Outcome: patient tolerated procedure well with no complications   Post-procedure details: wound care instructions given    Skin cancer screening   Lentigines - Scattered tan macules - Discussed due to sun exposure - Benign, observe - Call for any changes  Seborrheic Keratoses - Stuck-on, waxy, tan-brown papules and plaques  - Discussed benign etiology and prognosis. - Observe - Call for any changes  Melanocytic Nevi - Tan-brown and/or pink-flesh-colored symmetric macules and papules - Benign appearing on exam today - Observation - Call clinic for new or changing moles - Recommend daily use of broad spectrum spf 30+ sunscreen to sun-exposed areas.   Hemangiomas - Red papules - Discussed benign nature - Observe - Call for any changes  Actinic Damage - Chronic, secondary to cumulative UV/sun exposure - diffuse scaly erythematous macules with underlying dyspigmentation - Recommend daily broad spectrum sunscreen SPF 30+ to sun-exposed areas, reapply every 2 hours as needed.  - Call for new or changing lesions.  Purpura - Violaceous macules and patches - Benign - Related to age, sun damage and/or use of blood thinners - Observe - Can use OTC arnica containing moisturizer such as Dermend Bruise  Formula if desired - Call for worsening or other concerns  Skin cancer screening performed today.  Return in about 1 year (around 01/31/2021) for TBSE .  IMarye Round, CMA, am acting as scribe for Sarina Ser, MD .  Documentation: I have reviewed the above documentation for accuracy and completeness, and I agree with the above.  Sarina Ser, MD

## 2020-02-02 ENCOUNTER — Ambulatory Visit: Payer: Medicare Other | Admitting: Dermatology

## 2020-11-11 LAB — COLOGUARD: COLOGUARD: NEGATIVE

## 2021-02-01 ENCOUNTER — Encounter: Payer: Medicare Other | Admitting: Dermatology

## 2021-04-06 ENCOUNTER — Other Ambulatory Visit: Payer: Self-pay

## 2021-04-06 ENCOUNTER — Ambulatory Visit (INDEPENDENT_AMBULATORY_CARE_PROVIDER_SITE_OTHER): Payer: Medicare Other | Admitting: Dermatology

## 2021-04-06 DIAGNOSIS — D229 Melanocytic nevi, unspecified: Secondary | ICD-10-CM | POA: Diagnosis not present

## 2021-04-06 DIAGNOSIS — L814 Other melanin hyperpigmentation: Secondary | ICD-10-CM

## 2021-04-06 DIAGNOSIS — L219 Seborrheic dermatitis, unspecified: Secondary | ICD-10-CM

## 2021-04-06 DIAGNOSIS — L820 Inflamed seborrheic keratosis: Secondary | ICD-10-CM

## 2021-04-06 DIAGNOSIS — L570 Actinic keratosis: Secondary | ICD-10-CM | POA: Diagnosis not present

## 2021-04-06 DIAGNOSIS — I781 Nevus, non-neoplastic: Secondary | ICD-10-CM

## 2021-04-06 DIAGNOSIS — L821 Other seborrheic keratosis: Secondary | ICD-10-CM

## 2021-04-06 DIAGNOSIS — D1800 Hemangioma unspecified site: Secondary | ICD-10-CM

## 2021-04-06 DIAGNOSIS — Z1283 Encounter for screening for malignant neoplasm of skin: Secondary | ICD-10-CM

## 2021-04-06 DIAGNOSIS — L578 Other skin changes due to chronic exposure to nonionizing radiation: Secondary | ICD-10-CM | POA: Diagnosis not present

## 2021-04-06 MED ORDER — HYDROCORTISONE 2.5 % EX CREA: TOPICAL_CREAM | CUTANEOUS | 11 refills | Status: DC

## 2021-04-06 MED ORDER — KETOCONAZOLE 2 % EX CREA: TOPICAL_CREAM | CUTANEOUS | 11 refills | Status: DC

## 2021-04-06 NOTE — Patient Instructions (Addendum)
Cryotherapy Aftercare  Wash gently with soap and water everyday.   Apply Vaseline and Band-Aid daily until healed.    Seborrheic Dermatitis  What is seborrheic dermatitis? Seborrheic (say: seb-oh-ree-ick) dermatitis is a disease that causes flaking of the skin.  It usually affects the scalp.  In teenagers and adults, it is commonly called dandruff.  In infants, it is referred to as cradle cap.  Dandruff often appears as scaling on the scalp with or without redness.  On other parts of the body, seborrheic dermatitis tends to produce both redness and scaling.  Other common locations of seborrheic dermatitis include the central face, eyebrows, chest, and the creases of the arms, legs, and groin.  It often causes the skin to look a little greasy, scaly, or flaky. Seborrheic dermatitis can occur at any age.  It often comes and goes and may to be seasonally related, especially in the Northern climates.  What causes seborrheic dermatitis? The exact cause is not known, though yeast of the Malassezia species may be involved.  This organism is normally present on the skin in small numbers, but sometimes its numbers increase, especially in oily skin.  Treatments that reduce the yeast tend to improve seborrheic dermatitis.  How is seborrheic dermatitis treated? The treatment of seborrheic dermatitis depends on its location on the body and the persons age. Seborrheic dermatitis of the scalp (dandruff) in adults and teenagers is usually treated with a medicated shampoo.  Here is a list of the medications that help, and the over-counter shampoos that contain them: Salicylic acid (Neutrogena T/Sal, Sebulex, Scalpicin, Denorex Extra Strength) Zinc pyrithione (Head & Shoulders white bottle, Denorex Daily, DHS Zinc, Pantene Pro-V Pyrithione Zinc) Selenium sulfide (Head & Shoulders blue bottle, Selsun Blue, Exsel Lotion Shampoo, Glo-Sel) Yahoo tar (Neutrogena T/Gal, Pentrax, Zetar, Tegrin, Viacom, Therapeutic  Denorex) Ketoconazole (Nizoral)  If you have dandruff, you might start by using one of these shampoos every day until your dandruff is controlled and then keep using it at least twice a week.  Often times your doctor will recommend a rotation of several different medicated shampoos as some will experience a plateau in the effectiveness of any one shampoo.   When you use a dandruff shampoo, rub the shampoo into your wet hair and massage into scalp thoroughly.  Let it stay on your hair and scalp for 5 minutes before rinsing.  If you have involvement in the eyebrows or face, you can lather those areas with the medicated shampoo as well, or use a medicated soap (ZNP-bar, Polytar Soap, SAStid, or sulfur soap).    If the wash or shampoo alone does not help, your doctor might want you to use a prescription medication once or twice a day.  Leave-in medications for the scalp are best applied by massaging into the scalp immediately after towel drying your hair, but may be applied even if you have not washed your hair.  Seborrheic dermatitis in infants usually clears up by age 72 -61 months.  It may develop in the diaper area where it might be confused with diaper rash.  For milder cases you can try gently brushing out scales with a soft brush.  This is best done immediately after washing with a non-medicated baby shampoo Wynetta Emery and Royce Macadamia, etc.).  Your doctor may recommend a medicated shampoo or a prescription topical medication.       If You Need Anything After Your Visit  If you have any questions or concerns for your doctor, please  call our main line at [phone] and press option 4 to reach your doctor's medical assistant. If no one answers, please leave a voicemail as directed and we will return your call as soon as possible. Messages left after 4 pm will be answered the following business day.   You may also send [place] a message via [place]. We typically respond to MyChart messages within 1-2  business days.  For prescription refills, please ask your pharmacy to contact our office. Our fax number is [phone].  If you have an urgent issue when the clinic is closed that cannot wait until the next business day, you can page your doctor at the number below.    Please note that while we do our best to be available for urgent issues outside of office hours, we are not available 24/7.   If you have an urgent issue and are unable to reach [place], you may choose to seek medical care at your doctor's office, retail clinic, urgent care center, or emergency room.  If you have a medical emergency, please immediately call 911 or go to the emergency department.  Pager Numbers  - Dr. Nehemiah Massed: [phone]  - Dr. Laurence Ferrari: [phone]  - Dr. Nicole Kindred: [phone]  In the event of inclement weather, please call our main line at [phone] for an update on the status of any delays or closures.  Dermatology Medication Tips: Please keep the boxes that topical medications come in in order to help keep track of the instructions about where and how to use these. Pharmacies typically print the medication instructions only on the boxes and not directly on the medication tubes.   If your medication is too expensive, please contact our office at [phone] option 4 or send [place] a message through [place].   We are unable to tell what your co-pay for medications will be in advance as this is different depending on your insurance coverage. However, we may be able to find a substitute medication at lower cost or fill out paperwork to get insurance to cover a needed medication.   If a prior authorization is required to get your medication covered by your insurance company, please allow [place] 1-2 business days to complete this process.  Drug prices often vary depending on where the prescription is filled and some pharmacies may offer cheaper prices.  The website www.goodrx.com contains coupons for medications  through different pharmacies. The prices here do not account for what the cost may be with help from insurance (it may be cheaper with your insurance), but the website can give you the price if you did not use any insurance.  - You can print the associated coupon and take it with your prescription to the pharmacy.  - You may also stop by our office during regular business hours and pick up a GoodRx coupon card.  - If you need your prescription sent electronically to a different pharmacy, notify our office through [employer] or by phone at [phone] option 4.     Si Usted Necesita Algo Despus de Su Visita  Tambin puede enviarnos un mensaje a travs de Pharmacist, community. Por lo general respondemos a los mensajes de MyChart en el transcurso de 1 a 2 das hbiles.  Para renovar recetas, por favor pida a su farmacia que se ponga en contacto con nuestra oficina. Harland Dingwall de fax es [place] [phone].  Si tiene un asunto urgente cuando la clnica est cerrada y que no puede esperar hasta el siguiente da hbil,  puede llamar/localizar a su doctor(a) al nmero que aparece a continuacin.   Por favor, tenga en cuenta que aunque hacemos todo lo posible para estar disponibles para asuntos urgentes fuera del horario de [place], no estamos disponibles las 24 horas del da, los 7 das de la [place].   Si tiene un problema urgente y no puede comunicarse con nosotros, puede optar por buscar atencin mdica  en el consultorio de su doctor(a), en una clnica privada, en un centro de atencin urgente o en una sala de emergencias.  Si tiene Engineering geologist, por favor llame inmediatamente al 911 o vaya a la sala de emergencias.  Nmeros de bper  - Dr. Nehemiah Massed: [phone]  - Dra. Moye: [phone]  - Dra. Nicole Kindred: [phone]  En caso de inclemencias del [place], por favor llame a Johnsie Kindred principal al [phone] para una actualizacin sobre el [place] de cualquier retraso o  cierre.  Consejos para la medicacin en dermatologa: Por favor, guarde las cajas en las que vienen los medicamentos de uso tpico para ayudarle a seguir las instrucciones sobre dnde y cmo usarlos. Las farmacias generalmente imprimen las instrucciones del medicamento slo en las cajas y no directamente en los tubos del [place].   Si su medicamento es muy caro, por favor, pngase en contacto con Zigmund Daniel llamando al [phone] y presione la opcin 4 o envenos un mensaje a travs de Pharmacist, community.   No podemos decirle cul ser su copago por los medicamentos por adelantado ya que esto es diferente dependiendo de la cobertura de su seguro. Sin embargo, es posible que podamos encontrar un medicamento sustituto a Electrical engineer un formulario para que el seguro cubra el medicamento que se considera necesario.   Si se requiere una autorizacin previa para que su compaa de seguros [place] su medicamento, por favor permtanos de 1 a 2 das hbiles para completar este proceso.  Los precios de los medicamentos varan con frecuencia dependiendo del Environmental consultant de dnde se surte la receta y alguna farmacias pueden ofrecer precios ms baratos.  El sitio web www.goodrx.com tiene cupones para medicamentos de Airline pilot. Los precios aqu no tienen en cuenta lo que podra costar con la ayuda del seguro (puede ser ms barato con su seguro), pero el sitio web puede darle el precio si no utiliz Research scientist (physical sciences).  - Puede imprimir el cupn correspondiente y llevarlo con su receta a la farmacia.  - Tambin puede pasar por nuestra oficina durante el horario de atencin regular y Charity fundraiser una tarjeta de cupones de GoodRx.  - Si necesita que su receta se enve electrnicamente a una farmacia diferente, informe a nuestra oficina a travs de MyChart de [place] o por telfono llamando al [phone] y presione la opcin 4.

## 2021-04-06 NOTE — Progress Notes (Signed)
Follow-Up Visit   Subjective  Joshua Bass is a 71 y.o. male who presents for the following: Follow-up (Total body exam today. No hx of skin cancer or dysplastic nevi. ). The patient presents for Total-Body Skin Exam (TBSE) for skin cancer screening and mole check.  The patient has spots, moles and lesions to be evaluated, some may be new or changing and the patient has concerns that these could be cancer.  The following portions of the chart were reviewed this encounter and updated as appropriate:  Tobacco   Allergies   Meds   Problems   Med Hx   Surg Hx   Fam Hx       Review of Systems: No other skin or systemic complaints except as noted in HPI or Assessment and Plan.  Objective  Well appearing patient in no apparent distress; mood and affect are within normal limits.  A full examination was performed including scalp, head, eyes, ears, nose, lips, neck, chest, axillae, abdomen, back, buttocks, bilateral upper extremities, bilateral lower extremities, hands, feet, fingers, toes, fingernails, and toenails. All findings within normal limits unless otherwise noted below.  Scalp and brows Pink scaliness in brown and forehead  scalp and face x 15 (15) Erythematous thin papules/macules with gritty scale.   face, shoulders, left arm x 5 (5) Erythematous keratotic or waxy stuck-on papule or plaque.    Assessment & Plan  Seborrheic dermatitis Scalp and brows  Ketoconazole cream MWF at bedtime HC 2.5% cream T,Th,S at bedtime  Seborrheic Dermatitis  -  is a chronic persistent rash characterized by pinkness and scaling most commonly of the mid face but also can occur on the scalp (dandruff), ears; mid chest, mid back and groin.  It tends to be exacerbated by stress and cooler weather.  People who have neurologic disease may experience new onset or exacerbation of existing seborrheic dermatitis.  The condition is not curable but treatable and can be controlled.  ketoconazole  (NIZORAL) 2 % cream - Scalp and brows Apply to affected areas on Mondays, Wednesdays, and Fridays at bedtime hydrocortisone 2.5 % cream - Scalp and brows Apply to affected areas on Tuesdays, Thursdays, and Saturdays at bedtimes  AK (actinic keratosis) scalp and face x 15  Actinic keratoses are precancerous spots that appear secondary to cumulative UV radiation exposure/sun exposure over time. They are chronic with expected duration over 1 year. A portion of actinic keratoses will progress to squamous cell carcinoma of the skin. It is not possible to reliably predict which spots will progress to skin cancer and so treatment is recommended to prevent development of skin cancer.  Recommend daily broad spectrum sunscreen SPF 30+ to sun-exposed areas, reapply every 2 hours as needed.  Recommend staying in the shade or wearing long sleeves, sun glasses (UVA+UVB protection) and wide brim hats (4-inch brim around the entire circumference of the hat). Call for new or changing lesions.  Prior to procedure, discussed risks of blister formation, small wound, skin dyspigmentation, or rare scar following cryotherapy. Recommend Vaseline ointment to treated areas while healing.  Destruction of lesion - scalp and face x 15 Complexity: simple   Destruction method: cryotherapy   Informed consent: discussed and consent obtained   Timeout:  patient name, date of birth, surgical site, and procedure verified Lesion destroyed using liquid nitrogen: Yes   Region frozen until ice ball extended beyond lesion: Yes   Outcome: patient tolerated procedure well with no complications   Post-procedure details: wound care  instructions given    Inflamed seborrheic keratosis (5) face, shoulders, left arm x 5  Prior to procedure, discussed risks of blister formation, small wound, skin dyspigmentation, or rare scar following cryotherapy. Recommend Vaseline ointment to treated areas while healing.  Destruction of lesion -  face, shoulders, left arm x 5 Complexity: simple   Destruction method: cryotherapy   Informed consent: discussed and consent obtained   Timeout:  patient name, date of birth, surgical site, and procedure verified Lesion destroyed using liquid nitrogen: Yes   Region frozen until ice ball extended beyond lesion: Yes   Outcome: patient tolerated procedure well with no complications   Post-procedure details: wound care instructions given    Skin cancer screening  Lentigines - Scattered tan macules - Due to sun exposure - Benign-appearing, observe - Recommend daily broad spectrum sunscreen SPF 30+ to sun-exposed areas, reapply every 2 hours as needed. - Call for any changes  Seborrheic Keratoses - Stuck-on, waxy, tan-brown papules and/or plaques  - Benign-appearing - Discussed benign etiology and prognosis. - Observe - Call for any changes  Melanocytic Nevi - Tan-brown and/or pink-flesh-colored symmetric macules and papules - Benign appearing on exam today - Observation - Call clinic for new or changing moles - Recommend daily use of broad spectrum spf 30+ sunscreen to sun-exposed areas.   Hemangiomas - Red papules - Discussed benign nature - Observe - Call for any changes  Telangiectasia - Dilated blood vessel - Benign appearing on exam - Call for changes  Actinic Damage - Chronic condition, secondary to cumulative UV/sun exposure - diffuse scaly erythematous macules with underlying dyspigmentation - Recommend daily broad spectrum sunscreen SPF 30+ to sun-exposed areas, reapply every 2 hours as needed.  - Staying in the shade or wearing long sleeves, sun glasses (UVA+UVB protection) and wide brim hats (4-inch brim around the entire circumference of the hat) are also recommended for sun protection.  - Call for new or changing lesions.  Skin cancer screening performed today.  Return in about 6 months (around 10/04/2021) for AK, ISK f/u.  IHarriett Sine, CMA, am  acting as scribe for Sarina Ser, MD. Documentation: I have reviewed the above documentation for accuracy and completeness, and I agree with the above.  Sarina Ser, MD

## 2021-04-10 ENCOUNTER — Encounter: Payer: Self-pay | Admitting: Dermatology

## 2021-04-17 ENCOUNTER — Ambulatory Visit: Payer: Medicare Other | Admitting: Dermatology

## 2021-08-15 ENCOUNTER — Other Ambulatory Visit: Payer: Self-pay | Admitting: Infectious Diseases

## 2021-08-15 DIAGNOSIS — R413 Other amnesia: Secondary | ICD-10-CM

## 2021-08-24 ENCOUNTER — Ambulatory Visit
Admission: RE | Admit: 2021-08-24 | Discharge: 2021-08-24 | Disposition: A | Discharge status: Home / Self Care | Payer: Medicare Other | Source: Ambulatory Visit | Attending: Infectious Diseases | Admitting: Infectious Diseases

## 2021-08-24 DIAGNOSIS — R413 Other amnesia: Secondary | ICD-10-CM | POA: Insufficient documentation

## 2021-09-04 ENCOUNTER — Ambulatory Visit (INDEPENDENT_AMBULATORY_CARE_PROVIDER_SITE_OTHER): Payer: BLUE CROSS/BLUE SHIELD | Admitting: Dermatology

## 2021-09-04 DIAGNOSIS — L578 Other skin changes due to chronic exposure to nonionizing radiation: Secondary | ICD-10-CM | POA: Diagnosis not present

## 2021-09-04 DIAGNOSIS — L570 Actinic keratosis: Secondary | ICD-10-CM

## 2021-09-04 DIAGNOSIS — L820 Inflamed seborrheic keratosis: Secondary | ICD-10-CM

## 2021-09-04 DIAGNOSIS — L219 Seborrheic dermatitis, unspecified: Secondary | ICD-10-CM

## 2021-09-04 MED ORDER — KETOCONAZOLE 2 % EX CREA: TOPICAL_CREAM | CUTANEOUS | 11 refills | Status: DC

## 2021-09-04 MED ORDER — HYDROCORTISONE 2.5 % EX CREA: TOPICAL_CREAM | CUTANEOUS | 11 refills | Status: DC

## 2021-09-04 NOTE — Progress Notes (Signed)
Follow-Up Visit   Subjective  Joshua Bass is a 71 y.o. male who presents for the following: Actinic Keratosis (6 month follow up face, scalp treated with LN2). The patient has spots, moles and lesions to be evaluated, some may be new or changing and the patient has concerns that these could be cancer. Rash on face.  The following portions of the chart were reviewed this encounter and updated as appropriate:   Tobacco  Allergies  Meds  Problems  Med Hx  Surg Hx  Fam Hx     Review of Systems:  No other skin or systemic complaints except as noted in HPI or Assessment and Plan.  Objective  Well appearing patient in no apparent distress; mood and affect are within normal limits.  A focused examination was performed including scalp, face, arms. Relevant physical exam findings are noted in the Assessment and Plan.  [place] Pinkness and scale  Scalp (8) Erythematous thin papules/macules with gritty scale.    Assessment & Plan   Actinic Damage - chronic, secondary to cumulative UV radiation exposure/sun exposure over time - diffuse scaly erythematous macules with underlying dyspigmentation - Recommend daily broad spectrum sunscreen SPF 30+ to sun-exposed areas, reapply every 2 hours as needed.  - Recommend staying in the shade or wearing long sleeves, sun glasses (UVA+UVB protection) and wide brim hats (4-inch brim around the entire circumference of the hat). - Call for new or changing lesions.  Seborrheic dermatitis Glabella  Seborrheic Dermatitis  -  is a chronic persistent rash characterized by pinkness and scaling most commonly of the mid face but also can occur on the scalp (dandruff), ears; mid chest, mid back and groin.  It tends to be exacerbated by stress and cooler weather.  People who have neurologic disease may experience new onset or exacerbation of existing seborrheic dermatitis.  The condition is not curable but treatable and can be controlled.  Chronic  and persistent condition with duration or expected duration over one year. Condition is symptomatic / bothersome to patient. Not to goal.   Continue Ketoconazole 2% cream qhs Monday, Wednesday, Friday, HC 2.5% cream qhs Tuesday, Thursday, Saturday  ketoconazole (NIZORAL) 2 % cream - [place] Apply to affected areas on Mondays, Wednesdays, and Fridays at bedtime  hydrocortisone 2.5 % cream - [place] Apply to affected areas on Tuesdays, Thursdays, and Saturdays at bedtimes  AK (actinic keratosis) (8) Scalp  Destruction of lesion - Scalp Complexity: simple   Destruction method: cryotherapy   Informed consent: discussed and consent obtained   Timeout:  patient name, date of birth, surgical site, and procedure verified Lesion destroyed using liquid nitrogen: Yes   Region frozen until ice ball extended beyond lesion: Yes   Outcome: patient tolerated procedure well with no complications   Post-procedure details: wound care instructions given    Inflamed seborrheic keratosis (5) Scalp, forehead  Destruction of lesion - Scalp, forehead Complexity: simple   Destruction method: cryotherapy   Informed consent: discussed and consent obtained   Timeout:  patient name, date of birth, surgical site, and procedure verified Lesion destroyed using liquid nitrogen: Yes   Region frozen until ice ball extended beyond lesion: Yes   Outcome: patient tolerated procedure well with no complications   Post-procedure details: wound care instructions given     Return for 6-8 months AK follow up.  I, Ashok Cordia, CMA, am acting as scribe for Sarina Ser, MD . Documentation: I have reviewed the above documentation for accuracy and completeness,  and I agree with the above.  Sarina Ser, MD

## 2021-09-04 NOTE — Patient Instructions (Signed)
Cryotherapy Aftercare  Wash gently with soap and water everyday.   Apply Vaseline and Band-Aid daily until healed.     Due to recent changes in healthcare laws, you may see results of your pathology and/or laboratory studies on MyChart before the doctors have had a chance to review them. We understand that in some cases there may be results that are confusing or concerning to you. Please understand that not all results are received at the same time and often the doctors may need to interpret multiple results in order to provide you with the best plan of care or course of treatment. Therefore, we ask that you please give us 2 business days to thoroughly review all your results before contacting the office for clarification. Should we see a critical lab result, you will be contacted sooner.   If You Need Anything After Your Visit  If you have any questions or concerns for your doctor, please call our main line at [phone] and press option 4 to reach your doctor's medical assistant. If no one answers, please leave a voicemail as directed and we will return your call as soon as possible. Messages left after 4 pm will be answered the following business day.   You may also send us a message via MyChart. We typically respond to MyChart messages within 1-2 business days.  For prescription refills, please ask your pharmacy to contact our office. Our fax number is [phone].  If you have an urgent issue when the clinic is closed that cannot wait until the next business day, you can page your doctor at the number below.    Please note that while we do our best to be available for urgent issues outside of office hours, we are not available 24/7.   If you have an urgent issue and are unable to reach us, you may choose to seek medical care at your doctor's office, retail clinic, urgent care center, or emergency room.  If you have a medical emergency, please immediately call 911 or go to the  emergency department.  Pager Numbers  - Dr. Kowalski: [phone]  - Dr. Moye: [phone]  - Dr. Stewart: [phone]  In the event of inclement weather, please call our main line at [phone] for an update on the status of any delays or closures.  Dermatology Medication Tips: Please keep the boxes that topical medications come in in order to help keep track of the instructions about where and how to use these. Pharmacies typically print the medication instructions only on the boxes and not directly on the medication tubes.   If your medication is too expensive, please contact our office at [phone] option 4 or send us a message through MyChart.   We are unable to tell what your co-pay for medications will be in advance as this is different depending on your insurance coverage. However, we may be able to find a substitute medication at lower cost or fill out paperwork to get insurance to cover a needed medication.   If a prior authorization is required to get your medication covered by your insurance company, please allow us 1-2 business days to complete this process.  Drug prices often vary depending on where the prescription is filled and some pharmacies may offer cheaper prices.  The website www.goodrx.com contains coupons for medications through different pharmacies. The prices here do not account for what the cost may be with help from insurance (it may be cheaper with your insurance), but the website can   give you the price if you did not use any insurance.  - You can print the associated coupon and take it with your prescription to the pharmacy.  - You may also stop by our office during regular business hours and pick up a GoodRx coupon card.  - If you need your prescription sent electronically to a different pharmacy, notify our office through [facility] MyChart or by phone at [phone] option 4.     Si Usted Necesita Algo Despus de Su Visita  Tambin puede  enviarnos un mensaje a travs de MyChart. Por lo general respondemos a los mensajes de MyChart en el transcurso de 1 a 2 das hbiles.  Para renovar recetas, por favor pida a su farmacia que se ponga en contacto con nuestra oficina. Nuestro nmero de fax es el [phone].  Si tiene un asunto urgente cuando la clnica est cerrada y que no puede esperar hasta el siguiente da hbil, puede llamar/localizar a su doctor(a) al nmero que aparece a continuacin.   Por favor, tenga en cuenta que aunque hacemos todo lo posible para estar disponibles para asuntos urgentes fuera del horario de oficina, no estamos disponibles las 24 horas del da, los 7 das de la semana.   Si tiene un problema urgente y no puede comunicarse con nosotros, puede optar por buscar atencin mdica  en el consultorio de su doctor(a), en una clnica privada, en un centro de atencin urgente o en una sala de emergencias.  Si tiene una emergencia mdica, por favor llame inmediatamente al 911 o vaya a la sala de emergencias.  Nmeros de bper  - Dr. Kowalski: [phone]  - Dra. Moye: [phone]  - Dra. Stewart: [phone]  En caso de inclemencias del tiempo, por favor llame a nuestra lnea principal al [phone] para una actualizacin sobre el estado de cualquier retraso o cierre.  Consejos para la medicacin en dermatologa: Por favor, guarde las cajas en las que vienen los medicamentos de uso tpico para ayudarle a seguir las instrucciones sobre dnde y cmo usarlos. Las farmacias generalmente imprimen las instrucciones del medicamento slo en las cajas y no directamente en los tubos del medicamento.   Si su medicamento es muy caro, por favor, pngase en contacto con nuestra oficina llamando al [phone] y presione la opcin 4 o envenos un mensaje a travs de MyChart.   No podemos decirle cul ser su copago por los medicamentos por adelantado ya que esto es diferente dependiendo de la cobertura de su seguro.  Sin embargo, es posible que podamos encontrar un medicamento sustituto a menor costo o llenar un formulario para que el seguro cubra el medicamento que se considera necesario.   Si se requiere una autorizacin previa para que su compaa de seguros cubra su medicamento, por favor permtanos de 1 a 2 das hbiles para completar este proceso.  Los precios de los medicamentos varan con frecuencia dependiendo del lugar de dnde se surte la receta y alguna farmacias pueden ofrecer precios ms baratos.  El sitio web www.goodrx.com tiene cupones para medicamentos de diferentes farmacias. Los precios aqu no tienen en cuenta lo que podra costar con la ayuda del seguro (puede ser ms barato con su seguro), pero el sitio web puede darle el precio si no utiliz ningn seguro.  - Puede imprimir el cupn correspondiente y llevarlo con su receta a la farmacia.  - Tambin puede pasar por nuestra oficina durante el horario de atencin regular y recoger una tarjeta de cupones de GoodRx.  -   Si necesita que su receta se enve electrnicamente a una farmacia diferente, informe a nuestra oficina a travs de MyChart de [facility] o por telfono llamando al [phone] y presione la opcin 4.  

## 2021-09-05 ENCOUNTER — Encounter: Payer: Self-pay | Admitting: Dermatology

## 2021-09-29 DIAGNOSIS — G3184 Mild cognitive impairment, so stated: Secondary | ICD-10-CM | POA: Insufficient documentation

## 2021-09-29 DIAGNOSIS — R413 Other amnesia: Secondary | ICD-10-CM | POA: Insufficient documentation

## 2021-11-01 DIAGNOSIS — E538 Deficiency of other specified B group vitamins: Secondary | ICD-10-CM | POA: Insufficient documentation

## 2021-11-01 DIAGNOSIS — D649 Anemia, unspecified: Secondary | ICD-10-CM | POA: Insufficient documentation

## 2022-04-02 ENCOUNTER — Ambulatory Visit (INDEPENDENT_AMBULATORY_CARE_PROVIDER_SITE_OTHER): Payer: BLUE CROSS/BLUE SHIELD | Admitting: Dermatology

## 2022-04-02 VITALS — BP 141/69

## 2022-04-02 DIAGNOSIS — Z5111 Encounter for antineoplastic chemotherapy: Secondary | ICD-10-CM | POA: Diagnosis not present

## 2022-04-02 DIAGNOSIS — L821 Other seborrheic keratosis: Secondary | ICD-10-CM | POA: Diagnosis not present

## 2022-04-02 DIAGNOSIS — Z79899 Other long term (current) drug therapy: Secondary | ICD-10-CM

## 2022-04-02 DIAGNOSIS — L578 Other skin changes due to chronic exposure to nonionizing radiation: Secondary | ICD-10-CM | POA: Diagnosis not present

## 2022-04-02 DIAGNOSIS — L570 Actinic keratosis: Secondary | ICD-10-CM

## 2022-04-02 NOTE — Patient Instructions (Signed)
Instructions for [facility] Medications  One or more of your medications was sent to the [facility] mail order compounding pharmacy. You will receive an email from them and can purchase the medicine through that link. It will then be mailed to your home at the address you confirmed. If for any reason you do not receive an email from them, please check your spam folder. If you still do not find the email, please let us know. [facility] phone number is [phone].    Cryotherapy Aftercare  Wash gently with soap and water everyday.   Apply Vaseline and Band-Aid daily until healed.   Due to recent changes in healthcare laws, you may see results of your pathology and/or laboratory studies on MyChart before the doctors have had a chance to review them. We understand that in some cases there may be results that are confusing or concerning to you. Please understand that not all results are received at the same time and often the doctors may need to interpret multiple results in order to provide you with the best plan of care or course of treatment. Therefore, we ask that you please give us 2 business days to thoroughly review all your results before contacting the office for clarification. Should we see a critical lab result, you will be contacted sooner.   If You Need Anything After Your Visit  If you have any questions or concerns for your doctor, please call our main line at [phone] and press option 4 to reach your doctor's medical assistant. If no one answers, please leave a voicemail as directed and we will return your call as soon as possible. Messages left after 4 pm will be answered the following business day.   You may also send us a message via MyChart. We typically respond to MyChart messages within 1-2 business days.  For prescription refills, please ask your pharmacy to contact our office. Our fax number is [phone].  If you have an urgent issue when the clinic is  closed that cannot wait until the next business day, you can page your doctor at the number below.    Please note that while we do our best to be available for urgent issues outside of office hours, we are not available 24/7.   If you have an urgent issue and are unable to reach us, you may choose to seek medical care at your doctor's office, retail clinic, urgent care center, or emergency room.  If you have a medical emergency, please immediately call 911 or go to the emergency department.  Pager Numbers  - Dr. Kowalski: [phone]  - Dr. Moye: [phone]  - Dr. Stewart: [phone]  In the event of inclement weather, please call our main line at [phone] for an update on the status of any delays or closures.  Dermatology Medication Tips: Please keep the boxes that topical medications come in in order to help keep track of the instructions about where and how to use these. Pharmacies typically print the medication instructions only on the boxes and not directly on the medication tubes.   If your medication is too expensive, please contact our office at [phone] option 4 or send us a message through MyChart.   We are unable to tell what your co-pay for medications will be in advance as this is different depending on your insurance coverage. However, we may be able to find a substitute medication at lower cost or fill out paperwork to get insurance to cover a   needed medication.   If a prior authorization is required to get your medication covered by your insurance company, please allow us 1-2 business days to complete this process.  Drug prices often vary depending on where the prescription is filled and some pharmacies may offer cheaper prices.  The website www.goodrx.com contains coupons for medications through different pharmacies. The prices here do not account for what the cost may be with help from insurance (it may be cheaper with your insurance), but the website can  give you the price if you did not use any insurance.  - You can print the associated coupon and take it with your prescription to the pharmacy.  - You may also stop by our office during regular business hours and pick up a GoodRx coupon card.  - If you need your prescription sent electronically to a different pharmacy, notify our office through [facility] MyChart or by phone at [phone] option 4.     Si Usted Necesita Algo Despus de Su Visita  Tambin puede enviarnos un mensaje a travs de MyChart. Por lo general respondemos a los mensajes de MyChart en el transcurso de 1 a 2 das hbiles.  Para renovar recetas, por favor pida a su farmacia que se ponga en contacto con nuestra oficina. Nuestro nmero de fax es el [phone].  Si tiene un asunto urgente cuando la clnica est cerrada y que no puede esperar hasta el siguiente da hbil, puede llamar/localizar a su doctor(a) al nmero que aparece a continuacin.   Por favor, tenga en cuenta que aunque hacemos todo lo posible para estar disponibles para asuntos urgentes fuera del horario de oficina, no estamos disponibles las 24 horas del da, los 7 das de la semana.   Si tiene un problema urgente y no puede comunicarse con nosotros, puede optar por buscar atencin mdica  en el consultorio de su doctor(a), en una clnica privada, en un centro de atencin urgente o en una sala de emergencias.  Si tiene una emergencia mdica, por favor llame inmediatamente al 911 o vaya a la sala de emergencias.  Nmeros de bper  - Dr. Kowalski: [phone]  - Dra. Moye: [phone]  - Dra. Stewart: [phone]  En caso de inclemencias del tiempo, por favor llame a nuestra lnea principal al [phone] para una actualizacin sobre el estado de cualquier retraso o cierre.  Consejos para la medicacin en dermatologa: Por favor, guarde las cajas en las que vienen los medicamentos de uso tpico para ayudarle a seguir las instrucciones sobre  dnde y cmo usarlos. Las farmacias generalmente imprimen las instrucciones del medicamento slo en las cajas y no directamente en los tubos del medicamento.   Si su medicamento es muy caro, por favor, pngase en contacto con nuestra oficina llamando al [phone] y presione la opcin 4 o envenos un mensaje a travs de MyChart.   No podemos decirle cul ser su copago por los medicamentos por adelantado ya que esto es diferente dependiendo de la cobertura de su seguro. Sin embargo, es posible que podamos encontrar un medicamento sustituto a menor costo o llenar un formulario para que el seguro cubra el medicamento que se considera necesario.   Si se requiere una autorizacin previa para que su compaa de seguros cubra su medicamento, por favor permtanos de 1 a 2 das hbiles para completar este proceso.  Los precios de los medicamentos varan con frecuencia dependiendo del lugar de dnde se surte la receta y alguna farmacias pueden ofrecer precios ms baratos.    El sitio web www.goodrx.com tiene cupones para medicamentos de diferentes farmacias. Los precios aqu no tienen en cuenta lo que podra costar con la ayuda del seguro (puede ser ms barato con su seguro), pero el sitio web puede darle el precio si no utiliz ningn seguro.  - Puede imprimir el cupn correspondiente y llevarlo con su receta a la farmacia.  - Tambin puede pasar por nuestra oficina durante el horario de atencin regular y recoger una tarjeta de cupones de GoodRx.  - Si necesita que su receta se enve electrnicamente a una farmacia diferente, informe a nuestra oficina a travs de MyChart de [facility] o por telfono llamando al [phone] y presione la opcin 4.  

## 2022-04-02 NOTE — Progress Notes (Unsigned)
Follow-Up Visit   Subjective  Joshua Bass is a 72 y.o. male who presents for the following: Actinic Keratosis (6 month follow up - scalp treated with LN2). The patient has spots, moles and lesions to be evaluated, some may be new or changing and the patient has concerns that these could be cancer.  Accompanied by son  The following portions of the chart were reviewed this encounter and updated as appropriate:   Tobacco  Allergies  Meds  Problems  Med Hx  Surg Hx  Fam Hx     Review of Systems:  No other skin or systemic complaints except as noted in HPI or Assessment and Plan.  Objective  Well appearing patient in no apparent distress; mood and affect are within normal limits.  A focused examination was performed including scalp, face. Relevant physical exam findings are noted in the Assessment and Plan.  Scalp x 5, forehead x 2 (7) Erythematous thin papules/macules with gritty scale.    Assessment & Plan  AK (actinic keratosis) (7) Scalp x 5, forehead x 2  Actinic Damage with PreCancerous Actinic Keratoses Counseling for Topical Chemotherapy Management: Patient exhibits: - Severe, confluent actinic changes with pre-cancerous actinic keratoses that is secondary to cumulative UV radiation exposure over time - Condition that is severe; chronic, not at goal. - diffuse scaly erythematous macules and papules with underlying dyspigmentation - Discussed Prescription "Field Treatment" topical Chemotherapy for Severe, Chronic Confluent Actinic Changes with Pre-Cancerous Actinic Keratoses Field treatment involves treatment of an entire area of skin that has confluent Actinic Changes (Sun/ Ultraviolet light damage) and PreCancerous Actinic Keratoses by method of PhotoDynamic Therapy (PDT) and/or prescription Topical Chemotherapy agents such as 5-fluorouracil, 5-fluorouracil/calcipotriene, and/or imiquimod.  The purpose is to decrease the number of clinically evident and  subclinical PreCancerous lesions to prevent progression to development of skin cancer by chemically destroying early precancer changes that may or may not be visible.  It has been shown to reduce the risk of developing skin cancer in the treated area. As a result of treatment, redness, scaling, crusting, and open sores may occur during treatment course. One or more than one of these methods may be used and may have to be used several times to control, suppress and eliminate the PreCancerous changes. Discussed treatment course, expected reaction, and possible side effects. - Recommend daily broad spectrum sunscreen SPF 30+ to sun-exposed areas, reapply every 2 hours as needed.  - Staying in the shade or wearing long sleeves, sun glasses (UVA+UVB protection) and wide brim hats (4-inch brim around the entire circumference of the hat) are also recommended. - Call for new or changing lesions.  - Start 5-fluorouracil/calcipotriene cream twice a day for 7 days to affected areas including scalp. Prescription sent to [facility]. Patient advised they will receive an email to purchase the medication online and have it sent to their home. Patient provided with handout reviewing treatment course and side effects and advised to call or message [place] on MyChart with any concerns.   Destruction of lesion - Scalp x 5, forehead x 2 Complexity: simple   Destruction method: cryotherapy   Informed consent: discussed and consent obtained   Timeout:  patient name, date of birth, surgical site, and procedure verified Lesion destroyed using liquid nitrogen: Yes   Region frozen until ice ball extended beyond lesion: Yes   Outcome: patient tolerated procedure well with no complications   Post-procedure details: wound care instructions given    Seborrheic Keratoses -  Stuck-on, waxy, tan-brown papules and/or plaques  - Benign-appearing - Discussed benign etiology and prognosis. - Observe - Call for  any changes  Actinic Damage - chronic, secondary to cumulative UV radiation exposure/sun exposure over time - diffuse scaly erythematous macules with underlying dyspigmentation - Recommend daily broad spectrum sunscreen SPF 30+ to sun-exposed areas, reapply every 2 hours as needed.  - Recommend staying in the shade or wearing long sleeves, sun glasses (UVA+UVB protection) and wide brim hats (4-inch brim around the entire circumference of the hat). - Call for new or changing lesions.  Return for 6-12 months.  I, Ashok Cordia, CMA, am acting as scribe for Sarina Ser, MD . Documentation: I have reviewed the above documentation for accuracy and completeness, and I agree with the above.  Sarina Ser, MD

## 2022-04-03 ENCOUNTER — Encounter: Payer: Self-pay | Admitting: Dermatology

## 2022-05-03 ENCOUNTER — Ambulatory Visit (INDEPENDENT_AMBULATORY_CARE_PROVIDER_SITE_OTHER): Payer: Medicare Other | Admitting: Dermatology

## 2022-05-03 ENCOUNTER — Encounter: Payer: Self-pay | Admitting: Dermatology

## 2022-05-03 DIAGNOSIS — L2489 Irritant contact dermatitis due to other agents: Secondary | ICD-10-CM | POA: Diagnosis not present

## 2022-05-03 DIAGNOSIS — L219 Seborrheic dermatitis, unspecified: Secondary | ICD-10-CM | POA: Diagnosis not present

## 2022-05-03 MED ORDER — MOMETASONE FUROATE 0.1 % EX OINT: TOPICAL_OINTMENT | CUTANEOUS | 0 refills | Status: AC

## 2022-05-03 NOTE — Patient Instructions (Signed)
Due to recent changes in healthcare laws, you may see results of your pathology and/or laboratory studies on MyChart before the doctors have had a chance to review them. We understand that in some cases there may be results that are confusing or concerning to you. Please understand that not all results are received at the same time and often the doctors may need to interpret multiple results in order to provide you with the best plan of care or course of treatment. Therefore, we ask that you please give us 2 business days to thoroughly review all your results before contacting the office for clarification. Should we see a critical lab result, you will be contacted sooner.   If You Need Anything After Your Visit  If you have any questions or concerns for your doctor, please call our main line at [phone] and press option 4 to reach your doctor's medical assistant. If no one answers, please leave a voicemail as directed and we will return your call as soon as possible. Messages left after 4 pm will be answered the following business day.   You may also send us a message via MyChart. We typically respond to MyChart messages within 1-2 business days.  For prescription refills, please ask your pharmacy to contact our office. Our fax number is [phone].  If you have an urgent issue when the clinic is closed that cannot wait until the next business day, you can page your doctor at the number below.    Please note that while we do our best to be available for urgent issues outside of office hours, we are not available 24/7.   If you have an urgent issue and are unable to reach us, you may choose to seek medical care at your doctor's office, retail clinic, urgent care center, or emergency room.  If you have a medical emergency, please immediately call 911 or go to the emergency department.  Pager Numbers  - Dr. Kowalski: [phone]  - Dr. Moye: [phone]  - Dr. Stewart:  [phone]  In the event of inclement weather, please call our main line at [phone] for an update on the status of any delays or closures.  Dermatology Medication Tips: Please keep the boxes that topical medications come in in order to help keep track of the instructions about where and how to use these. Pharmacies typically print the medication instructions only on the boxes and not directly on the medication tubes.   If your medication is too expensive, please contact our office at [phone] option 4 or send us a message through MyChart.   We are unable to tell what your co-pay for medications will be in advance as this is different depending on your insurance coverage. However, we may be able to find a substitute medication at lower cost or fill out paperwork to get insurance to cover a needed medication.   If a prior authorization is required to get your medication covered by your insurance company, please allow us 1-2 business days to complete this process.  Drug prices often vary depending on where the prescription is filled and some pharmacies may offer cheaper prices.  The website www.goodrx.com contains coupons for medications through different pharmacies. The prices here do not account for what the cost may be with help from insurance (it may be cheaper with your insurance), but the website can give you the price if you did not use any insurance.  - You can print the associated coupon and take it with   your prescription to the pharmacy.  - You may also stop by our office during regular business hours and pick up a GoodRx coupon card.  - If you need your prescription sent electronically to a different pharmacy, notify our office through [facility] MyChart or by phone at [phone] option 4.     Si Usted Necesita Algo Despus de Su Visita  Tambin puede enviarnos un mensaje a travs de MyChart. Por lo general respondemos a los mensajes de MyChart en el transcurso de 1 a 2  das hbiles.  Para renovar recetas, por favor pida a su farmacia que se ponga en contacto con nuestra oficina. Nuestro nmero de fax es el [phone].  Si tiene un asunto urgente cuando la clnica est cerrada y que no puede esperar hasta el siguiente da hbil, puede llamar/localizar a su doctor(a) al nmero que aparece a continuacin.   Por favor, tenga en cuenta que aunque hacemos todo lo posible para estar disponibles para asuntos urgentes fuera del horario de oficina, no estamos disponibles las 24 horas del da, los 7 das de la semana.   Si tiene un problema urgente y no puede comunicarse con nosotros, puede optar por buscar atencin mdica  en el consultorio de su doctor(a), en una clnica privada, en un centro de atencin urgente o en una sala de emergencias.  Si tiene una emergencia mdica, por favor llame inmediatamente al 911 o vaya a la sala de emergencias.  Nmeros de bper  - Dr. Kowalski: [phone]  - Dra. Moye: [phone]  - Dra. Stewart: [phone]  En caso de inclemencias del tiempo, por favor llame a nuestra lnea principal al [phone] para una actualizacin sobre el estado de cualquier retraso o cierre.  Consejos para la medicacin en dermatologa: Por favor, guarde las cajas en las que vienen los medicamentos de uso tpico para ayudarle a seguir las instrucciones sobre dnde y cmo usarlos. Las farmacias generalmente imprimen las instrucciones del medicamento slo en las cajas y no directamente en los tubos del medicamento.   Si su medicamento es muy caro, por favor, pngase en contacto con nuestra oficina llamando al [phone] y presione la opcin 4 o envenos un mensaje a travs de MyChart.   No podemos decirle cul ser su copago por los medicamentos por adelantado ya que esto es diferente dependiendo de la cobertura de su seguro. Sin embargo, es posible que podamos encontrar un medicamento sustituto a menor costo o llenar un formulario para que el  seguro cubra el medicamento que se considera necesario.   Si se requiere una autorizacin previa para que su compaa de seguros cubra su medicamento, por favor permtanos de 1 a 2 das hbiles para completar este proceso.  Los precios de los medicamentos varan con frecuencia dependiendo del lugar de dnde se surte la receta y alguna farmacias pueden ofrecer precios ms baratos.  El sitio web www.goodrx.com tiene cupones para medicamentos de diferentes farmacias. Los precios aqu no tienen en cuenta lo que podra costar con la ayuda del seguro (puede ser ms barato con su seguro), pero el sitio web puede darle el precio si no utiliz ningn seguro.  - Puede imprimir el cupn correspondiente y llevarlo con su receta a la farmacia.  - Tambin puede pasar por nuestra oficina durante el horario de atencin regular y recoger una tarjeta de cupones de GoodRx.  - Si necesita que su receta se enve electrnicamente a una farmacia diferente, informe a nuestra oficina a travs de MyChart de [place]   o por telfono llamando al [phone] y presione la opcin 4.  

## 2022-05-03 NOTE — Progress Notes (Signed)
   Follow-Up Visit   Subjective  Joshua Bass is a 72 y.o. male who presents for the following: Rash (Check a rash that appeared on his face 6 days ago, areas on his face are red and swollen, patient using a cream he does not know the name of the cream he is using on his face.).  Son with patient  The following portions of the chart were reviewed this encounter and updated as appropriate:   Tobacco  Allergies  Meds  Problems  Med Hx  Surg Hx  Fam Hx     Review of Systems:  No other skin or systemic complaints except as noted in HPI or Assessment and Plan.  Objective  Well appearing patient in no apparent distress; mood and affect are within normal limits.  A focused examination was performed including face,scalp. Relevant physical exam findings are noted in the Assessment and Plan.  face Edema and erythema   face Mild pinkess   Assessment & Plan  Irritant contact dermatitis due to accidental / incorrect use of 5FU/Calcipotriene cream  face  Contact dermatitis from 5FU/Calcipotriene cream  D/C 5FU/Calcipotriene cream   Mometasone ointment apply to face twice a day x 2 weeks   Seborrheic dermatitis face Seborrheic Dermatitis  -  is a chronic persistent rash characterized by pinkness and scaling most commonly of the mid face but also can occur on the scalp (dandruff), ears; mid chest, mid back and groin.  It tends to be exacerbated by stress and cooler weather.  People who have neurologic disease may experience new onset or exacerbation of existing seborrheic dermatitis.  The condition is not curable but treatable and can be controlled.   When contact irritation on his face is cleared from 5FU/Calcipotriene usage, may start Ketoconazole cream apply to face at bedtime Mon, Wed, Fri and start Hydrocortisone 2.5% cream apply to face at bedtime Tues, Thurs and Sat   Related Medications ketoconazole (NIZORAL) 2 % cream Apply to affected areas on Mondays, Wednesdays, and  Fridays at bedtime hydrocortisone 2.5 % cream Apply to affected areas on Tuesdays, Thursdays, and Saturdays at bedtimes  Return if symptoms worsen or fail to improve.  IMarye Round, CMA, am acting as scribe for Sarina Ser, MD .  Documentation: I have reviewed the above documentation for accuracy and completeness, and I agree with the above.  Sarina Ser, MD

## 2022-05-14 ENCOUNTER — Encounter: Payer: Self-pay | Admitting: Dermatology

## 2022-12-06 ENCOUNTER — Encounter: Payer: Self-pay | Admitting: Dermatology

## 2022-12-06 ENCOUNTER — Ambulatory Visit (INDEPENDENT_AMBULATORY_CARE_PROVIDER_SITE_OTHER): Payer: Medicare Other | Admitting: Dermatology

## 2022-12-06 DIAGNOSIS — Z79899 Other long term (current) drug therapy: Secondary | ICD-10-CM

## 2022-12-06 DIAGNOSIS — W908XXA Exposure to other nonionizing radiation, initial encounter: Secondary | ICD-10-CM | POA: Diagnosis not present

## 2022-12-06 DIAGNOSIS — D692 Other nonthrombocytopenic purpura: Secondary | ICD-10-CM

## 2022-12-06 DIAGNOSIS — L578 Other skin changes due to chronic exposure to nonionizing radiation: Secondary | ICD-10-CM

## 2022-12-06 DIAGNOSIS — L570 Actinic keratosis: Secondary | ICD-10-CM | POA: Diagnosis not present

## 2022-12-06 DIAGNOSIS — L820 Inflamed seborrheic keratosis: Secondary | ICD-10-CM | POA: Diagnosis not present

## 2022-12-06 DIAGNOSIS — L219 Seborrheic dermatitis, unspecified: Secondary | ICD-10-CM

## 2022-12-06 DIAGNOSIS — Z7189 Other specified counseling: Secondary | ICD-10-CM

## 2022-12-06 MED ORDER — HYDROCORTISONE 2.5 % EX CREA: TOPICAL_CREAM | CUTANEOUS | 11 refills | Status: DC

## 2022-12-06 MED ORDER — KETOCONAZOLE 2 % EX CREA: TOPICAL_CREAM | CUTANEOUS | 11 refills | Status: DC

## 2022-12-06 NOTE — Patient Instructions (Addendum)
For irritation and scale at eyebrows/face:  Use Ketoconazole cream apply to face at bedtime Mon, Wed, Fri   Use Hydrocortisone 2.5% cream apply to face at bedtime Tues, Thurs and Sat    Cryotherapy Aftercare  Wash gently with soap and water everyday.   Apply Vaseline Jelly daily until healed.    Recommend daily broad spectrum sunscreen SPF 30+ to sun-exposed areas, reapply every 2 hours as needed. Call for new or changing lesions.  Staying in the shade or wearing long sleeves, sun glasses (UVA+UVB protection) and wide brim hats (4-inch brim around the entire circumference of the hat) are also recommended for sun protection.   Due to recent changes in healthcare laws, you may see results of your pathology and/or laboratory studies on MyChart before the doctors have had a chance to review them. We understand that in some cases there may be results that are confusing or concerning to you. Please understand that not all results are received at the same time and often the doctors may need to interpret multiple results in order to provide you with the best plan of care or course of treatment. Therefore, we ask that you please give [place] 2 business days to thoroughly review all your results before contacting the office for clarification. Should we see a critical lab result, you will be contacted sooner.   If You Need Anything After Your Visit  If you have any questions or concerns for your doctor, please call our main line at [phone] and press option 4 to reach your doctor's medical assistant. If no one answers, please leave a voicemail as directed and we will return your call as soon as possible. Messages left after 4 pm will be answered the following business day.   You may also send [place] a message via MyChart. We typically respond to MyChart messages within 1-2 business days.  For prescription refills, please ask your pharmacy to contact our office. Our fax number is [phone].  If you have  an urgent issue when the clinic is closed that cannot wait until the next business day, you can page your doctor at the number below.    Please note that while we do our best to be available for urgent issues outside of office hours, we are not available 24/7.   If you have an urgent issue and are unable to reach [place], you may choose to seek medical care at your doctor's office, retail clinic, urgent care center, or emergency room.  If you have a medical emergency, please immediately call 911 or go to the emergency department.  Pager Numbers  - Dr. Gwen Pounds: [phone]  - Dr. Roseanne Reno: [phone]  - Dr. Katrinka Blazing: [phone]   In the event of inclement weather, please call our main line at [phone] for an update on the status of any delays or closures.  Dermatology Medication Tips: Please keep the boxes that topical medications come in in order to help keep track of the instructions about where and how to use these. Pharmacies typically print the medication instructions only on the boxes and not directly on the medication tubes.   If your medication is too expensive, please contact our office at [phone] option 4 or send [place] a message through MyChart.   We are unable to tell what your co-pay for medications will be in advance as this is different depending on your insurance coverage. However, we may be able to find a substitute medication at lower cost or fill out paperwork to  get insurance to cover a needed medication.   If a prior authorization is required to get your medication covered by your insurance company, please allow [place] 1-2 business days to complete this process.  Drug prices often vary depending on where the prescription is filled and some pharmacies may offer cheaper prices.  The website www.goodrx.com contains coupons for medications through different pharmacies. The prices here do not account for what the cost may be with help from insurance (it may be cheaper with  your insurance), but the website can give you the price if you did not use any insurance.  - You can print the associated coupon and take it with your prescription to the pharmacy.  - You may also stop by our office during regular business hours and pick up a GoodRx coupon card.  - If you need your prescription sent electronically to a different pharmacy, notify our office through [facility] or by phone at [phone] option 4.     Si Usted Necesita Algo Despus de Su Visita  Tambin puede enviarnos un mensaje a travs de Clinical cytogeneticist. Por lo general respondemos a los mensajes de MyChart en el transcurso de 1 a 2 das hbiles.  Para renovar recetas, por favor pida a su farmacia que se ponga en contacto con nuestra oficina. Annie Sable de fax es [place] [phone].  Si tiene un asunto urgente cuando la clnica est cerrada y que no puede esperar hasta el siguiente da hbil, puede llamar/localizar a su doctor(a) al nmero que aparece a continuacin.   Por favor, tenga en cuenta que aunque hacemos todo lo posible para estar disponibles para asuntos urgentes fuera del horario de [place], no estamos disponibles las 24 horas del da, los 7 [address]  [address] de la [place].   Si tiene un problema urgente y no puede comunicarse con nosotros, puede optar por buscar atencin mdica  en el consultorio de su doctor(a), en una clnica privada, en un centro de atencin urgente o en una sala de emergencias.  Si tiene Engineer, drilling, por favor llame inmediatamente al 911 o vaya a la sala de emergencias.  Nmeros de bper  - Dr. Gwen Pounds: [phone]  - Dra. Roseanne Reno: [phone]  - Dr. Katrinka Blazing: [phone]   En caso de inclemencias del tiempo, por favor llame a Lacy Duverney principal al [phone] para una actualizacin sobre el [place] de cualquier retraso o cierre.  Consejos para la medicacin en dermatologa: Por favor, guarde las cajas en las que vienen los medicamentos de uso tpico para  ayudarle a seguir las instrucciones sobre dnde y cmo usarlos. Las farmacias generalmente imprimen las instrucciones del medicamento slo en las cajas y no directamente en los tubos del [place].   Si su medicamento es muy caro, por favor, pngase en contacto con Rolm Gala llamando al [phone] y presione la opcin 4 o envenos un mensaje a travs de Clinical cytogeneticist.   No podemos decirle cul ser su copago por los medicamentos por adelantado ya que esto es diferente dependiendo de la cobertura de su seguro. Sin embargo, es posible que podamos encontrar un medicamento sustituto a Audiological scientist un formulario para que el seguro cubra el medicamento que se considera necesario.   Si se requiere una autorizacin previa para que su compaa de seguros [place] su medicamento, por favor permtanos de 1 a 2 das hbiles para completar [address]  Los precios de los medicamentos varan con frecuencia dependiendo del Environmental consultant de dnde se surte la receta y [place]  pueden ofrecer precios ms baratos.  El sitio web www.goodrx.com tiene cupones para medicamentos de Health and safety inspector. Los precios aqu no tienen en cuenta lo que podra costar con la ayuda del seguro (puede ser ms barato con su seguro), pero el sitio web puede darle el precio si no utiliz Tourist information centre manager.  - Puede imprimir el cupn correspondiente y llevarlo con su receta a la farmacia.  - Tambin puede pasar por nuestra oficina durante el horario de atencin regular y Education officer, museum una tarjeta de cupones de GoodRx.  - Si necesita que su receta se enve electrnicamente a una farmacia diferente, informe a nuestra oficina a travs de MyChart de [place] o por telfono llamando al [phone] y presione la opcin 4.

## 2022-12-06 NOTE — Progress Notes (Signed)
Follow-Up Visit   Subjective  Joshua Bass is a 72 y.o. male who presents for the following: AK follow up. Face, scalp. Hx of LN2 treatment. Status post 5FU/Calcipotriene treatment on scalp in February.  Had accidentally used it on his face in February.   Still has some trouble with irritation and scale on eyebrows.    The patient has spots, moles and lesions to be evaluated, some may be new or changing and the patient may have concern these could be cancer.   The following portions of the chart were reviewed this encounter and updated as appropriate: medications, allergies, medical history  Review of Systems:  No other skin or systemic complaints except as noted in HPI or Assessment and Plan.  Objective  Well appearing patient in no apparent distress; mood and affect are within normal limits.  A focused examination was performed of the following areas: Scalp, face, neck, ears, hands.  Relevant exam findings are noted in the Assessment and Plan.  left helix x1, scalp x7 (8) Erythematous thin papules/macules with gritty scale.   Scalp x4 (4) Erythematous keratotic or waxy stuck-on papule or plaque.    Assessment & Plan     SEBORRHEIC DERMATITIS Exam: Pink patches with greasy scale at medial brows and glabella  Chronic and persistent condition with duration or expected duration over one year. Condition is symptomatic / bothersome to patient. Not to goal.  Seborrheic Dermatitis is a chronic persistent rash characterized by pinkness and scaling most commonly of the mid face but also can occur on the scalp (dandruff), ears; mid chest, mid back and groin.  It tends to be exacerbated by stress and cooler weather.  People who have neurologic disease may experience new onset or exacerbation of existing seborrheic dermatitis.  The condition is not curable but treatable and can be controlled.  Treatment Plan: Use Ketoconazole cream apply to face at bedtime Mon, Wed, Fri  Use  Hydrocortisone 2.5% cream apply to face at bedtime Tues, Thurs and Sat     AK (actinic keratosis) (8) left helix x1, scalp x7  Actinic keratoses are precancerous spots that appear secondary to cumulative UV radiation exposure/sun exposure over time. They are chronic with expected duration over 1 year. A portion of actinic keratoses will progress to squamous cell carcinoma of the skin. It is not possible to reliably predict which spots will progress to skin cancer and so treatment is recommended to prevent development of skin cancer.  Recommend daily broad spectrum sunscreen SPF 30+ to sun-exposed areas, reapply every 2 hours as needed.  Recommend staying in the shade or wearing long sleeves, sun glasses (UVA+UVB protection) and wide brim hats (4-inch brim around the entire circumference of the hat). Call for new or changing lesions.  Destruction of lesion - left helix x1, scalp x7 (8) Complexity: simple   Destruction method: cryotherapy   Informed consent: discussed and consent obtained   Timeout:  patient name, date of birth, surgical site, and procedure verified Lesion destroyed using liquid nitrogen: Yes   Region frozen until ice ball extended beyond lesion: Yes   Outcome: patient tolerated procedure well with no complications   Post-procedure details: wound care instructions given   Additional details:  Prior to procedure, discussed risks of blister formation, small wound, skin dyspigmentation, or rare scar following cryotherapy. Recommend Vaseline ointment to treated areas while healing.   Inflamed seborrheic keratosis (4) Scalp x4  Symptomatic, irritating, patient would like treated.  Destruction of lesion - Scalp  x4 (4) Complexity: simple   Destruction method: cryotherapy   Informed consent: discussed and consent obtained   Timeout:  patient name, date of birth, surgical site, and procedure verified Lesion destroyed using liquid nitrogen: Yes   Region frozen until ice ball  extended beyond lesion: Yes   Outcome: patient tolerated procedure well with no complications   Post-procedure details: wound care instructions given   Additional details:  Prior to procedure, discussed risks of blister formation, small wound, skin dyspigmentation, or rare scar following cryotherapy. Recommend Vaseline ointment to treated areas while healing.   Seborrheic dermatitis  Related Medications hydrocortisone 2.5 % cream Apply to affected areas on Tuesdays, Thursdays, and Saturdays at bedtimes  ketoconazole (NIZORAL) 2 % cream Apply to affected areas on Mondays, Wednesdays, and Fridays at bedtime  Actinic skin damage  Medication management  Counseling and coordination of care  Purpura (HCC)   Purpura - Chronic; persistent and recurrent.  Treatable, but not curable. - Violaceous macules and patches at arms - Benign - Related to trauma, age, sun damage and/or use of blood thinners, chronic use of topical and/or oral steroids - Observe - Can use OTC arnica containing moisturizer such as Dermend Bruise Formula if desired - Call for worsening or other concerns  ACTINIC DAMAGE - chronic, secondary to cumulative UV radiation exposure/sun exposure over time - diffuse scaly erythematous macules with underlying dyspigmentation - Recommend daily broad spectrum sunscreen SPF 30+ to sun-exposed areas, reapply every 2 hours as needed.  - Recommend staying in the shade or wearing long sleeves, sun glasses (UVA+UVB protection) and wide brim hats (4-inch brim around the entire circumference of the hat). - Call for new or changing lesions.   Return in about 6 months (around 06/05/2023) for AK Follow Up.  I, Lawson Radar, CMA, am acting as scribe for Armida Sans, MD.   Documentation: I have reviewed the above documentation for accuracy and completeness, and I agree with the above.  Armida Sans, MD

## 2022-12-09 ENCOUNTER — Encounter: Payer: Self-pay | Admitting: Dermatology

## 2023-02-25 ENCOUNTER — Ambulatory Visit: Payer: Medicare Other | Admitting: Dermatology

## 2023-02-25 ENCOUNTER — Encounter: Payer: Self-pay | Admitting: Dermatology

## 2023-02-25 DIAGNOSIS — L720 Epidermal cyst: Secondary | ICD-10-CM

## 2023-02-25 DIAGNOSIS — L723 Sebaceous cyst: Secondary | ICD-10-CM

## 2023-02-25 DIAGNOSIS — L219 Seborrheic dermatitis, unspecified: Secondary | ICD-10-CM

## 2023-02-25 MED ORDER — HYDROCORTISONE 2.5 % EX CREA: TOPICAL_CREAM | CUTANEOUS | 11 refills | Status: DC

## 2023-02-25 MED ORDER — KETOCONAZOLE 2 % EX CREA: TOPICAL_CREAM | CUTANEOUS | 11 refills | Status: DC

## 2023-02-25 MED ORDER — DOXYCYCLINE MONOHYDRATE 100 MG PO CAPS: 100.0000 mg | ORAL_CAPSULE | Freq: Two times a day (BID) | ORAL | 0 refills | Status: AC

## 2023-02-25 NOTE — Patient Instructions (Signed)
Leave band-aid on until tomorrow.   Wash once daily in the shower. Shampoo running down is fine as a cleanser.   Apply Vaseline.  Cover with bandage.   Take Doxycycline twice daily with food for 2 weeks.   Doxycycline should be taken with food to prevent nausea. Do not lay down for 30 minutes after taking. Be cautious with sun exposure and use good sun protection while on this medication. Pregnant women should not take this medication.       Recommend daily broad spectrum sunscreen SPF 30+ to sun-exposed areas, reapply every 2 hours as needed. Call for new or changing lesions.  Staying in the shade or wearing long sleeves, sun glasses (UVA+UVB protection) and wide brim hats (4-inch brim around the entire circumference of the hat) are also recommended for sun protection.     Due to recent changes in healthcare laws, you may see results of your pathology and/or laboratory studies on MyChart before the doctors have had a chance to review them. We understand that in some cases there may be results that are confusing or concerning to you. Please understand that not all results are received at the same time and often the doctors may need to interpret multiple results in order to provide you with the best plan of care or course of treatment. Therefore, we ask that you please give [place] 2 business days to thoroughly review all your results before contacting the office for clarification. Should we see a critical lab result, you will be contacted sooner.   If You Need Anything After Your Visit  If you have any questions or concerns for your doctor, please call our main line at [phone] and press option 4 to reach your doctor's medical assistant. If no one answers, please leave a voicemail as directed and we will return your call as soon as possible. Messages left after 4 pm will be answered the following business day.   You may also send [place] a message via MyChart. We typically respond to MyChart  messages within 1-2 business days.  For prescription refills, please ask your pharmacy to contact our office. Our fax number is [phone].  If you have an urgent issue when the clinic is closed that cannot wait until the next business day, you can page your doctor at the number below.    Please note that while we do our best to be available for urgent issues outside of office hours, we are not available 24/7.   If you have an urgent issue and are unable to reach [place], you may choose to seek medical care at your doctor's office, retail clinic, urgent care center, or emergency room.  If you have a medical emergency, please immediately call 911 or go to the emergency department.  Pager Numbers  - Dr. Gwen Pounds: [phone]  - Dr. Roseanne Reno: [phone]  - Dr. Katrinka Blazing: [phone]   In the event of inclement weather, please call our main line at [phone] for an update on the status of any delays or closures.  Dermatology Medication Tips: Please keep the boxes that topical medications come in in order to help keep track of the instructions about where and how to use these. Pharmacies typically print the medication instructions only on the boxes and not directly on the medication tubes.   If your medication is too expensive, please contact our office at [phone] option 4 or send [place] a message through MyChart.   We are unable to tell what your co-pay for  medications will be in advance as this is different depending on your insurance coverage. However, we may be able to find a substitute medication at lower cost or fill out paperwork to get insurance to cover a needed medication.   If a prior authorization is required to get your medication covered by your insurance company, please allow [place] 1-2 business days to complete this process.  Drug prices often vary depending on where the prescription is filled and some pharmacies may offer cheaper prices.  The website www.goodrx.com contains  coupons for medications through different pharmacies. The prices here do not account for what the cost may be with help from insurance (it may be cheaper with your insurance), but the website can give you the price if you did not use any insurance.  - You can print the associated coupon and take it with your prescription to the pharmacy.  - You may also stop by our office during regular business hours and pick up a GoodRx coupon card.  - If you need your prescription sent electronically to a different pharmacy, notify our office through [facility] or by phone at [phone] option 4.     Si Usted Necesita Algo Despus de Su Visita  Tambin puede enviarnos un mensaje a travs de Clinical cytogeneticist. Por lo general respondemos a los mensajes de MyChart en el transcurso de 1 a 2 das hbiles.  Para renovar recetas, por favor pida a su farmacia que se ponga en contacto con nuestra oficina. Annie Sable de fax es [place] [phone].  Si tiene un asunto urgente cuando la clnica est cerrada y que no puede esperar hasta el siguiente da hbil, puede llamar/localizar a su doctor(a) al nmero que aparece a continuacin.   Por favor, tenga en cuenta que aunque hacemos todo lo posible para estar disponibles para asuntos urgentes fuera del horario de [place], no estamos disponibles las 24 horas del da, los 7 [address]  [address] de la [place].   Si tiene un problema urgente y no puede comunicarse con nosotros, puede optar por buscar atencin mdica  en el consultorio de su doctor(a), en una clnica privada, en un centro de atencin urgente o en una sala de emergencias.  Si tiene Engineer, drilling, por favor llame inmediatamente al 911 o vaya a la sala de emergencias.  Nmeros de bper  - Dr. Gwen Pounds: [phone]  - Dra. Roseanne Reno: [phone]  - Dr. Katrinka Blazing: [phone]   En caso de inclemencias del tiempo, por favor llame a Lacy Duverney principal al [phone] para una actualizacin sobre el [place] de  cualquier retraso o cierre.  Consejos para la medicacin en dermatologa: Por favor, guarde las cajas en las que vienen los medicamentos de uso tpico para ayudarle a seguir las instrucciones sobre dnde y cmo usarlos. Las farmacias generalmente imprimen las instrucciones del medicamento slo en las cajas y no directamente en los tubos del [place].   Si su medicamento es muy caro, por favor, pngase en contacto con Rolm Gala llamando al [phone] y presione la opcin 4 o envenos un mensaje a travs de Clinical cytogeneticist.   No podemos decirle cul ser su copago por los medicamentos por adelantado ya que esto es diferente dependiendo de la cobertura de su seguro. Sin embargo, es posible que podamos encontrar un medicamento sustituto a Audiological scientist un formulario para que el seguro cubra el medicamento que se considera necesario.   Si se requiere una autorizacin previa para que su compaa de seguros [place] su medicamento, por favor  permtanos de 1 a 2 das hbiles para completar [address]  Los precios de los medicamentos varan con frecuencia dependiendo del Environmental consultant de dnde se surte la receta y alguna farmacias pueden ofrecer precios ms baratos.  El sitio web www.goodrx.com tiene cupones para medicamentos de Health and safety inspector. Los precios aqu no tienen en cuenta lo que podra costar con la ayuda del seguro (puede ser ms barato con su seguro), pero el sitio web puede darle el precio si no utiliz Tourist information centre manager.  - Puede imprimir el cupn correspondiente y llevarlo con su receta a la farmacia.  - Tambin puede pasar por nuestra oficina durante el horario de atencin regular y Education officer, museum una tarjeta de cupones de GoodRx.  - Si necesita que su receta se enve electrnicamente a una farmacia diferente, informe a nuestra oficina a travs de MyChart de [place] o por telfono llamando al [phone] y presione la opcin 4.

## 2023-02-25 NOTE — Progress Notes (Signed)
Follow-Up Visit   Subjective  Joshua Bass is a 72 y.o. male who presents for the following: Spot on back of neck. 7-10 days. Painful under pressure. Has not noticed any drainage.  The patient has spots, moles and lesions to be evaluated, some may be new or changing and the patient may have concern these could be cancer.  His son, Trey Paula, is with patient and contributes to history.    The following portions of the chart were reviewed this encounter and updated as appropriate: medications, allergies, medical history  Review of Systems:  No other skin or systemic complaints except as noted in HPI or Assessment and Plan.  Objective  Well appearing patient in no apparent distress; mood and affect are within normal limits.  A focused examination was performed of the following areas: Neck  Relevant physical exam findings are noted in the Assessment and Plan.  Left Occipital Scalp Subcutaneous nodule with erythema and edema, tender to touch.        Assessment & Plan   Inflamed epidermoid cyst of skin Left Occipital Scalp  Start Doxycycline 100 mg twice daily with food for 2 weeks.   Doxycycline should be taken with food to prevent nausea. Do not lay down for 30 minutes after taking. Be cautious with sun exposure and use good sun protection while on this medication. Pregnant women should not take this medication.   Incision and Drainage - Left Occipital Scalp Location: left occipital scalp  Informed Consent: Discussed risks (permanent scarring, light or dark discoloration, infection, pain, bleeding, bruising, redness, damage to adjacent structures, and recurrence of the lesion) and benefits of the procedure, as well as the alternatives.  Informed consent was obtained.  Preparation: The area was prepped with alcohol.  Anesthesia: Lidocaine 1% with epinephrine  Procedure Details: An incision was made overlying the lesion. The lesion drained pus, white, chalky cyst  material, and blood.  A small amount of fluid was drained.    Antibiotic ointment and a sterile pressure dressing were applied. The patient tolerated procedure well.  Total number of lesions drained: 1  Plan: The patient was instructed on post-op care. Recommend OTC analgesia as needed for pain.   doxycycline (MONODOX) 100 MG capsule - Left Occipital Scalp Take 1 capsule (100 mg total) by mouth 2 (two) times daily for 14 days. Take with food  Skin / nail biopsy - Left Occipital Scalp Type of biopsy: punch   Informed consent: discussed and consent obtained   Timeout: patient name, date of birth, surgical site, and procedure verified   Procedure prep:  Patient was prepped and draped in usual sterile fashion Prep type:  Isopropyl alcohol Anesthesia: the lesion was anesthetized in a standard fashion   Anesthetic:  1% lidocaine w/ epinephrine 1-100,000 buffered w/ 8.4% NaHCO3 Punch size:  4 mm Hemostasis achieved with: aluminum chloride   Outcome: patient tolerated procedure well   Post-procedure details: sterile dressing applied and wound care instructions given   Dressing type: pressure dressing and petrolatum    Specimen 1 - Surgical pathology Differential Diagnosis: Inflamed Epidermal Cyst vs other  Check Margins: No  Seborrheic dermatitis  Related Medications hydrocortisone 2.5 % cream Apply to affected areas on Tuesdays, Thursdays, and Saturdays at bedtimes  ketoconazole (NIZORAL) 2 % cream Apply to affected areas on Mondays, Wednesdays, and Fridays at bedtime   SEBORRHEIC DERMATITIS Exam: Pink patches with greasy scale at face  Chronic and persistent condition with duration or expected duration over one year.  Condition is flaring and not currently at goal.   Seborrheic Dermatitis is a chronic persistent rash characterized by pinkness and scaling most commonly of the mid face but also can occur on the scalp (dandruff), ears; mid chest, mid back and groin.  It tends to  be exacerbated by stress and cooler weather.  People who have neurologic disease may experience new onset or exacerbation of existing seborrheic dermatitis.  The condition is not curable but treatable and can be controlled.  Treatment Plan:  hydrocortisone 2.5 % cream Apply to affected areas on Tuesdays, Thursdays, and Saturdays at bedtimes   ketoconazole (NIZORAL) 2 % cream Apply to affected areas on Mondays, Wednesdays, and Fridays at bedtime   Return for Follow Up As Scheduled, With Dr. Gwen Pounds.  I, Lawson Radar, CMA, am acting as scribe for Elie Goody, MD.   Documentation: I have reviewed the above documentation for accuracy and completeness, and I agree with the above.  Elie Goody, MD

## 2023-02-27 LAB — SURGICAL PATHOLOGY

## 2023-02-28 ENCOUNTER — Telehealth: Payer: Self-pay

## 2023-02-28 NOTE — Telephone Encounter (Signed)
Patient advised pathology showed benign cyst, JS

## 2023-02-28 NOTE — Telephone Encounter (Signed)
-----   Message from [place] sent at 02/27/2023  5:37 PM EST ----- Diagnosis: left occipital scalp :       EPIDERMOID CYST, INFLAMED AND DISRUPTED    Plan: please call to share that biopsy shows inflamed benign cyst as expected and get update on wound. Patient has cognitive and memory impairment so may need to speak to son Trey Paula under patient contacts)
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
## 2023-06-12 ENCOUNTER — Ambulatory Visit: Payer: Medicare Other | Admitting: Dermatology

## 2023-07-18 ENCOUNTER — Ambulatory Visit: Admitting: Dermatology

## 2023-07-18 ENCOUNTER — Encounter: Payer: Self-pay | Admitting: Dermatology

## 2023-07-18 DIAGNOSIS — L080 Pyoderma: Secondary | ICD-10-CM | POA: Diagnosis not present

## 2023-07-18 DIAGNOSIS — D229 Melanocytic nevi, unspecified: Secondary | ICD-10-CM

## 2023-07-18 DIAGNOSIS — L814 Other melanin hyperpigmentation: Secondary | ICD-10-CM | POA: Diagnosis not present

## 2023-07-18 DIAGNOSIS — C44619 Basal cell carcinoma of skin of left upper limb, including shoulder: Secondary | ICD-10-CM | POA: Diagnosis not present

## 2023-07-18 DIAGNOSIS — Z1283 Encounter for screening for malignant neoplasm of skin: Secondary | ICD-10-CM | POA: Diagnosis not present

## 2023-07-18 DIAGNOSIS — D492 Neoplasm of unspecified behavior of bone, soft tissue, and skin: Secondary | ICD-10-CM | POA: Diagnosis not present

## 2023-07-18 DIAGNOSIS — C4491 Basal cell carcinoma of skin, unspecified: Secondary | ICD-10-CM

## 2023-07-18 DIAGNOSIS — D1801 Hemangioma of skin and subcutaneous tissue: Secondary | ICD-10-CM

## 2023-07-18 DIAGNOSIS — L570 Actinic keratosis: Secondary | ICD-10-CM | POA: Diagnosis not present

## 2023-07-18 DIAGNOSIS — L219 Seborrheic dermatitis, unspecified: Secondary | ICD-10-CM | POA: Diagnosis not present

## 2023-07-18 DIAGNOSIS — Z79899 Other long term (current) drug therapy: Secondary | ICD-10-CM

## 2023-07-18 DIAGNOSIS — L578 Other skin changes due to chronic exposure to nonionizing radiation: Secondary | ICD-10-CM

## 2023-07-18 DIAGNOSIS — W908XXA Exposure to other nonionizing radiation, initial encounter: Secondary | ICD-10-CM

## 2023-07-18 DIAGNOSIS — L821 Other seborrheic keratosis: Secondary | ICD-10-CM

## 2023-07-18 HISTORY — DX: Basal cell carcinoma of skin, unspecified: C44.91

## 2023-07-18 HISTORY — DX: Actinic keratosis: L57.0

## 2023-07-18 MED ORDER — HYDROCORTISONE 2.5 % EX CREA: TOPICAL_CREAM | CUTANEOUS | 11 refills | Status: AC

## 2023-07-18 MED ORDER — KETOCONAZOLE 2 % EX CREA: TOPICAL_CREAM | CUTANEOUS | 11 refills | Status: AC

## 2023-07-18 NOTE — Progress Notes (Signed)
 Follow-Up Visit   Subjective  Joshua Bass is a 73 y.o. male who presents for the following: Skin Cancer Screening and Full Body Skin Exam, hx of Aks, Seb derm face, HC 2.5% cr prn flares, Ketoconazole  2% cr prn flares  Patient accompanied by son who contributes to history.  The patient presents for Total-Body Skin Exam (TBSE) for skin cancer screening and mole check. The patient has spots, moles and lesions to be evaluated, some may be new or changing and the patient may have concern these could be cancer.    The following portions of the chart were reviewed this encounter and updated as appropriate: medications, allergies, medical history  Review of Systems:  No other skin or systemic complaints except as noted in HPI or Assessment and Plan.  Objective  Well appearing patient in no apparent distress; mood and affect are within normal limits.  A full examination was performed including scalp, head, eyes, ears, nose, lips, neck, chest, axillae, abdomen, back, buttocks, bilateral upper extremities, bilateral lower extremities, hands, feet, fingers, toes, fingernails, and toenails. All findings within normal limits unless otherwise noted below.   Relevant physical exam findings are noted in the Assessment and Plan.  L upper arm 8.70mm pink pap with telangiectasias   L infraorbital 11mm pink scaly crusted patch   Assessment & Plan   SKIN CANCER SCREENING PERFORMED TODAY.  ACTINIC DAMAGE - Chronic condition, secondary to cumulative UV/sun exposure - diffuse scaly erythematous macules with underlying dyspigmentation - Recommend daily broad spectrum sunscreen SPF 30+ to sun-exposed areas, reapply every 2 hours as needed.  - Staying in the shade or wearing long sleeves, sun glasses (UVA+UVB protection) and wide brim hats (4-inch brim around the entire circumference of the hat) are also recommended for sun protection.  - Call for new or changing lesions.  LENTIGINES,  SEBORRHEIC KERATOSES, HEMANGIOMAS - Benign normal skin lesions - Benign-appearing - Call for any changes  MELANOCYTIC NEVI - Tan-brown and/or pink-flesh-colored symmetric macules and papules - Benign appearing on exam today - Observation - Call clinic for new or changing moles - Recommend daily use of broad spectrum spf 30+ sunscreen to sun-exposed areas.   SEBORRHEIC DERMATITIS face Exam: greasy scaly in eyebrows  Chronic condition with duration or expected duration over one year. Flaring   Seborrheic Dermatitis is a chronic persistent rash characterized by pinkness and scaling most commonly of the mid face but also can occur on the scalp (dandruff), ears; mid chest, mid back and groin.  It tends to be exacerbated by stress and cooler weather.  People who have neurologic disease may experience new onset or exacerbation of existing seborrheic dermatitis.  The condition is not curable but treatable and can be controlled.  Treatment Plan: Cont Ketoconazole  2% cr to face 3d/wk Monday, Wednesday, Friday Cont HC 2.5% cr to face 3d/wk Tuesday, Thursday, Saturday  Topical steroids (such as triamcinolone, fluocinolone, fluocinonide, mometasone , clobetasol, halobetasol, betamethasone, hydrocortisone ) can cause thinning and lightening of the skin if they are used for too long in the same area. Your physician has selected the right strength medicine for your problem and area affected on the body. Please use your medication only as directed by your physician to prevent side effects.   Long term medication management.  Patient is using long term (months to years) prescription medication  to control their dermatologic condition.  These medications require periodic monitoring to evaluate for efficacy and side effects and may require periodic laboratory monitoring.   HISTORY  OF PRECANCEROUS ACTINIC KERATOSIS - site(s) of PreCancerous Actinic Keratosis clear today. - these may recur and new lesions may  form requiring treatment to prevent transformation into skin cancer - observe for new or changing spots and contact [facility] for appointment if occur - photoprotection with sun protective clothing; sunglasses and broad spectrum sunscreen with SPF of at least 30 + and frequent self skin exams recommended - yearly exams by a dermatologist recommended for persons with history of PreCancerous Actinic Keratoses  NEOPLASM OF SKIN (2) L upper arm Skin / nail biopsy Type of biopsy: tangential   Informed consent: discussed and consent obtained   Timeout: patient name, date of birth, surgical site, and procedure verified   Procedure prep:  Patient was prepped and draped in usual sterile fashion Prep type:  Isopropyl alcohol Anesthesia: the lesion was anesthetized in a standard fashion   Anesthetic:  1% lidocaine w/ epinephrine 1-100,000 buffered w/ 8.4% NaHCO3 Instrument used: DermaBlade   Hemostasis achieved with: pressure and aluminum chloride   Outcome: patient tolerated procedure well   Post-procedure details: sterile dressing applied and wound care instructions given   Dressing type: bandage and bacitracin   Specimen 1 - Surgical pathology Differential Diagnosis: R/O BCC  Check Margins: No 8.34mm pink pap with telangiectasias L infraorbital Skin / nail biopsy Type of biopsy: tangential   Informed consent: discussed and consent obtained   Timeout: patient name, date of birth, surgical site, and procedure verified   Procedure prep:  Patient was prepped and draped in usual sterile fashion Prep type:  Isopropyl alcohol Anesthesia: the lesion was anesthetized in a standard fashion   Anesthetic:  1% lidocaine w/ epinephrine 1-100,000 buffered w/ 8.4% NaHCO3 Instrument used: DermaBlade   Hemostasis achieved with: pressure and aluminum chloride   Outcome: patient tolerated procedure well   Post-procedure details: sterile dressing applied and wound care instructions given   Dressing  type: bandage and bacitracin   Specimen 2 - Surgical pathology Differential Diagnosis: ISK vs SCC  Check Margins: No 11mm pink scaly crusted patch SEBORRHEIC DERMATITIS   Related Medications ketoconazole  (NIZORAL ) 2 % cream Apply to affected areas face on Mondays, Wednesdays, and Fridays at bedtime for seborrheic dermatitis hydrocortisone  2.5 % cream Apply to affected areas face on Tuesdays, Thursdays, and Saturdays at bedtimes for seborrheic dermatitis LENTIGINES   MULTIPLE BENIGN NEVI   SEBORRHEIC KERATOSES   ACTINIC ELASTOSIS   CHERRY ANGIOMA   Return in about 6 months (around 01/17/2024) for TBSE, Hx of AKs.  I, Rollie Clipper, RMA, am acting as scribe for Harris Liming, MD .   Documentation: I have reviewed the above documentation for accuracy and completeness, and I agree with the above.  Harris Liming, MD

## 2023-07-18 NOTE — Patient Instructions (Addendum)

## 2023-07-24 LAB — SURGICAL PATHOLOGY

## 2023-07-25 ENCOUNTER — Telehealth: Payer: Self-pay

## 2023-07-25 NOTE — Telephone Encounter (Signed)
 Left patients son message to call for bx results./sh

## 2023-07-25 NOTE — Telephone Encounter (Signed)
-----   Message from [facility] sent at 07/25/2023  9:24 AM EDT ----- Diagnosis: 1. Skin, L upper arm :       BASAL CELL CARCINOMA, NODULAR AND INFILTRATIVE PATTERNS        2. 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  1. L upper arm: Explanation: your biopsy shows basal cell skin cancers in the second layer of the skin. This is the most common kind of skin cancer and is caused by damage from sun exposure. Basal cell skin cancers almost never spread beyond the skin, so they are not dangerous to your overall health. However, they will continue to grow, can bleed, cause nonhealing wounds, and disrupt nearby structures unless fully treated.   Treatment: Excision - you return for an hour long appointment in our clinic where we perform a skin surgery. We numb the site of the skin cancer and a safety margin of normal skin around it. We remove the full thickness of skin and close the wound with two layers of stitches. The sample is sent to the lab to check that the skin cancer was fully removed. Return one week later to have wound checked and surface stitches removed. Surgical wound leaves a line scar. Approximately 95% cure rate. Risk of recurrence, bleeding, infection, pain, injury to nearby structures, hypertrophic scar.   2. L infraorbital Biopsy shows precancerous change from sun damage and inflammation. It may resolve without treatment. Recheck in 2 months

## 2023-08-12 ENCOUNTER — Ambulatory Visit: Payer: Self-pay

## 2023-11-27 ENCOUNTER — Ambulatory Visit: Admitting: Dermatology

## 2023-11-27 ENCOUNTER — Encounter: Payer: Self-pay | Admitting: Dermatology

## 2023-11-27 DIAGNOSIS — C44619 Basal cell carcinoma of skin of left upper limb, including shoulder: Secondary | ICD-10-CM | POA: Diagnosis not present

## 2023-11-27 MED ORDER — MUPIROCIN 2 % EX OINT: 1.0000 | TOPICAL_OINTMENT | Freq: Every day | CUTANEOUS | 0 refills | Status: AC

## 2023-11-27 NOTE — Patient Instructions (Signed)

## 2023-11-27 NOTE — Progress Notes (Signed)
   Follow-Up Visit   Subjective  Joshua Bass is a 73 y.o. male who presents for the following: Excision of bx proven BCC at left upper arm  The following portions of the chart were reviewed this encounter and updated as appropriate: medications, allergies, medical history  Review of Systems:  No other skin or systemic complaints except as noted in HPI or Assessment and Plan.  Objective  Well appearing patient in no apparent distress; mood and affect are within normal limits.  A focused examination was performed of the following areas: Left arm Relevant physical exam findings are noted in the Assessment and Plan.   Left Upper Arm Pink bx site  Assessment & Plan   BASAL CELL CARCINOMA (BCC) OF SKIN OF LEFT UPPER EXTREMITY INCLUDING SHOULDER Left Upper Arm Skin excision  Excision method:  elliptical Lesion length (cm):  0.7 Margin per side (cm):  0.4 Total excision diameter (cm):  1.5 Informed consent: discussed and consent obtained   Timeout: patient name, date of birth, surgical site, and procedure verified   Procedure prep:  Patient was prepped and draped in usual sterile fashion Prep type:  Chlorhexidine Anesthesia: the lesion was anesthetized in a standard fashion   Anesthetic:  1% lidocaine w/ epinephrine 1-100,000 buffered w/ 8.4% NaHCO3 (12 cc) Instrument used: #15 blade   Hemostasis achieved with: suture, pressure and electrodesiccation   Outcome: patient tolerated procedure well with no complications   Additional details:  Superior tag  Skin repair Complexity:  Intermediate Final length (cm):  4.4 Informed consent: discussed and consent obtained   Timeout: patient name, date of birth, surgical site, and procedure verified   Procedure prep:  Patient was prepped and draped in usual sterile fashion Prep type:  Chlorhexidine Anesthesia: the lesion was anesthetized in a standard fashion   Anesthetic:  1% lidocaine w/ epinephrine 1-100,000 buffered w/ 8.4%  NaHCO3 Reason for type of repair: reduce tension to allow closure, reduce the risk of dehiscence, infection, and necrosis, reduce subcutaneous dead space and avoid a hematoma, allow closure of the large defect and preserve normal anatomy   Undermining: edges could be approximated without difficulty   Subcutaneous layers (deep stitches):  Suture size:  4-0 Suture type: Monocryl (poliglecaprone 25)   Stitches:  Buried vertical mattress Fine/surface layer approximation (top stitches):  Suture size:  5-0 Suture type: Prolene (polypropylene)   Stitches comment:  Running locked Suture removal (days):  7 Hemostasis achieved with: suture, pressure and electrodesiccation Outcome: patient tolerated procedure well with no complications   Post-procedure details: sterile dressing applied and wound care instructions given   Dressing type: petrolatum, bandage and pressure dressing    Specimen 1 - Surgical pathology Differential Diagnosis: BX proven BCC  Check Margins: yes [id number] Superior tag   Return in about 1 week (around 12/04/2023) for Suture Removal.  LILLETTE Lonell Drones, RMA, am acting as scribe for Boneta Sharps, MD .   Documentation: I have reviewed the above documentation for accuracy and completeness, and I agree with the above.  Boneta Sharps, MD

## 2023-11-28 ENCOUNTER — Telehealth: Payer: Self-pay

## 2023-11-28 NOTE — Telephone Encounter (Signed)
 Spoke with patient's son and patient is doing well after yesterday's surgery. Lonell RAMAN., RMA

## 2023-11-29 LAB — SURGICAL PATHOLOGY

## 2023-12-02 ENCOUNTER — Ambulatory Visit: Payer: Self-pay | Admitting: Dermatology

## 2023-12-02 NOTE — Telephone Encounter (Signed)
-----   Message from [place] sent at 12/02/2023  9:02 AM EDT ----- Diagnosis left upper arm :       NO RESIDUAL BASAL CELL CARCINOMA, MARGINS FREE   Please call to share that excision was clear of basal cell skin cancer and get update on surgical wound. Thank you. ----- Message ----- From: Interface, [facility] Sent: 11/29/2023   4:37 PM EDT To: Boneta Sharps, MD

## 2023-12-02 NOTE — Telephone Encounter (Signed)
Left message for patient to return call regarding results  

## 2023-12-05 ENCOUNTER — Ambulatory Visit (INDEPENDENT_AMBULATORY_CARE_PROVIDER_SITE_OTHER): Admitting: Dermatology

## 2023-12-05 ENCOUNTER — Encounter: Payer: Self-pay | Admitting: Dermatology

## 2023-12-05 DIAGNOSIS — Z5189 Encounter for other specified aftercare: Secondary | ICD-10-CM

## 2023-12-05 DIAGNOSIS — Z4802 Encounter for removal of sutures: Secondary | ICD-10-CM

## 2023-12-05 DIAGNOSIS — Z48817 Encounter for surgical aftercare following surgery on the skin and subcutaneous tissue: Secondary | ICD-10-CM

## 2023-12-05 NOTE — Patient Instructions (Signed)

## 2023-12-05 NOTE — Progress Notes (Signed)
   Follow-Up Visit   Subjective  Joshua Bass is a 73 y.o. male who presents for the following: 1 wk s/p excision of BCC L upper arm, margins free bx proven  Patient accompanied by caregiver who contributes to history.  The following portions of the chart were reviewed this encounter and updated as appropriate: medications, allergies, medical history  Review of Systems:  No other skin or systemic complaints except as noted in HPI or Assessment and Plan.  Objective  Well appearing patient in no apparent distress; mood and affect are within normal limits.   A focused examination was performed of the following areas: L arm  Relevant exam findings are noted in the Assessment and Plan.    Assessment & Plan   BASAL CEL CARCINOMA, Margins free Bx proven L upper arm Exam: healing excision site  Treatment Plan: Encounter for Removal of Sutures - Incision site at the L upper forearm is clean, dry and intact - Wound cleansed, sutures removed, wound cleansed and steri strips applied.  - Discussed pathology results showing BCC margins free  - Patient advised to keep steri-strips dry until they fall off. - Scars remodel for a full year. - Once steri-strips fall off, patient can apply over-the-counter silicone scar cream each night to help with scar remodeling if desired. - Patient advised to call with any concerns or if they notice any new or changing lesions.    VISIT FOR WOUND CHECK   ENCOUNTER FOR REMOVAL OF SUTURES    Return for as scheduled for TBSE, Hx of BCC, Hx of AKs.  I, Grayce Saunas, RMA, am acting as scribe for Boneta Sharps, MD .   Documentation: I have reviewed the above documentation for accuracy and completeness, and I agree with the above.  Boneta Sharps, MD

## 2024-01-06 ENCOUNTER — Ambulatory Visit (INDEPENDENT_AMBULATORY_CARE_PROVIDER_SITE_OTHER): Admitting: Urology

## 2024-01-06 VITALS — BP 135/70 | HR 67 | Ht 69.0 in | Wt 170.0 lb

## 2024-01-06 DIAGNOSIS — R32 Unspecified urinary incontinence: Secondary | ICD-10-CM

## 2024-01-06 DIAGNOSIS — R351 Nocturia: Secondary | ICD-10-CM | POA: Diagnosis not present

## 2024-01-06 LAB — URINALYSIS, COMPLETE
Bilirubin, UA: NEGATIVE
Glucose, UA: NEGATIVE
Ketones, UA: NEGATIVE
Leukocytes,UA: NEGATIVE
Nitrite, UA: NEGATIVE
Protein,UA: NEGATIVE
RBC, UA: NEGATIVE
Specific Gravity, UA: 1.015 (ref 1.005–1.030)
Urobilinogen, Ur: 0.2 mg/dL (ref 0.2–1.0)
pH, UA: 6 (ref 5.0–7.5)

## 2024-01-06 LAB — MICROSCOPIC EXAMINATION: Bacteria, UA: NONE SEEN

## 2024-01-06 MED ORDER — MIRABEGRON ER 50 MG PO TB24: 50.0000 mg | ORAL_TABLET | Freq: Every day | ORAL | 11 refills | Status: AC

## 2024-01-06 NOTE — Progress Notes (Signed)
 01/06/2024 1:41 PM   Joshua Bass [birth year] [id number]  Referring provider: Epifanio Alm SQUIBB, MD [address] [place],  [place] [address]  Chief Complaint  Patient presents with   Establish Care   Urinary Incontinence    HPI: I was consulted to assess the patient's frequency every 20 to 30 minutes over the last year.  He does not complain of a lot of urgency.  He failed oxybutynin.  Another medication he did not try because it was $700.  I do not think he is on Flomax.  He is having memory issues.  He reported no nocturia and his son said he gets up 4 times  He describes normal PSAs  No history of kidney stones or bladder surgery.  No other neurologic risk factors  PMH: Past Medical History:  Diagnosis Date   Actinic keratosis 07/18/2023   L infraorbital   Basal cell carcinoma 07/18/2023   L upper arm. Excision 11/27/23. No residual BCC, margins free    Surgical History: No past surgical history on file.  Home Medications:  Allergies as of 01/06/2024   No Known Allergies      Medication List        Accurate as of January 06, 2024  1:41 PM. If you have any questions, ask your nurse or doctor.          donepezil 10 MG tablet Commonly known as: ARICEPT Take 10 mg by mouth daily.   hydrocortisone  2.5 % cream Apply to affected areas face on Tuesdays, Thursdays, and Saturdays at bedtimes for seborrheic dermatitis   ketoconazole  2 % cream Commonly known as: NIZORAL  Apply to affected areas face on Mondays, Wednesdays, and Fridays at bedtime for seborrheic dermatitis   mirtazapine 7.5 MG tablet Commonly known as: REMERON Take 7.5 mg by mouth.   mometasone  0.1 % ointment Commonly known as: ELOCON  Apply to face twice a day for 2 weeks then stop   mupirocin  ointment 2 % Commonly known as: BACTROBAN  Apply 1 Application topically daily.        Allergies: No Known Allergies  Family History: Family History  Problem Relation Age of  Onset   Alzheimer's disease Mother     Social History:  reports that he has never smoked. He has never used smokeless tobacco. He reports that he does not drink alcohol and does not use drugs.  ROS:                                        Physical Exam: BP 135/70   Pulse 67   Ht 5' 9 (1.753 m)   Wt 77.1 kg   BMI 25.10 kg/m   Constitutional:  Alert and oriented, No acute distress. HEENT: [place] AT, moist mucus membranes.  Trachea midline, no masses. Cardiovascular: No clubbing, cyanosis, or edema. Respiratory: Normal respiratory effort, no increased work of breathing. GI: Abdomen is soft, nontender, nondistended, no abdominal masses GU: No CVA tenderness.  50 g benign prostate Skin: No rashes, bruises or suspicious lesions. Lymph: No cervical or inguinal adenopathy. Neurologic: Grossly intact, no focal deficits, moving all 4 extremities. Psychiatric: Normal mood and affect.  Laboratory Data: Lab Results  Component Value Date   WBC 6.6 04/30/2019   HGB 14.7 04/30/2019   HCT 42.4 04/30/2019   MCV 90 04/30/2019   PLT 263 04/30/2019    Lab Results  Component Value Date  CREATININE 1.10 04/30/2019    No results found for: PSA  No results found for: TESTOSTERONE  Lab Results  Component Value Date   HGBA1C 5.5 04/30/2019    Urinalysis    Component Value Date/Time   APPEARANCEUR Clear 04/30/2019 1134   GLUCOSEU Negative 04/30/2019 1134   BILIRUBINUR Negative 04/30/2019 1134   PROTEINUR Negative 04/30/2019 1134   NITRITE Negative 04/30/2019 1134   LEUKOCYTESUR Negative 04/30/2019 1134    Pertinent Imaging: Urine normal and sent for culture.  Chart reviewed  Assessment & Plan: Patient has a diagnosis of mild dementia.  PSA in January 2025 was 1.51.  He has an overactive bladder.  I called Myrbetriq 50 mg 30 x 11.  Perform cystoscopy next visit.  Percutaneous tibial nerve stimulation like is an option.  He is a retired International aid/development worker.   Flomax is another potential option  1. Nocturia (Primary)  - Urinalysis, Complete  2. Urinary incontinence, unspecified type  - Urinalysis, Complete   No follow-ups on file.  Glendia DELENA Elizabeth, MD  [facility] [address], [address] [place], [place] [address] [phone]

## 2024-01-06 NOTE — Patient Instructions (Signed)

## 2024-01-09 LAB — CULTURE, URINE COMPREHENSIVE

## 2024-01-14 ENCOUNTER — Ambulatory Visit: Admitting: Dermatology

## 2024-01-14 ENCOUNTER — Encounter: Payer: Self-pay | Admitting: Dermatology

## 2024-01-14 DIAGNOSIS — W908XXA Exposure to other nonionizing radiation, initial encounter: Secondary | ICD-10-CM

## 2024-01-14 DIAGNOSIS — L814 Other melanin hyperpigmentation: Secondary | ICD-10-CM | POA: Diagnosis not present

## 2024-01-14 DIAGNOSIS — Z1283 Encounter for screening for malignant neoplasm of skin: Secondary | ICD-10-CM | POA: Diagnosis not present

## 2024-01-14 DIAGNOSIS — L570 Actinic keratosis: Secondary | ICD-10-CM | POA: Diagnosis not present

## 2024-01-14 DIAGNOSIS — D1801 Hemangioma of skin and subcutaneous tissue: Secondary | ICD-10-CM

## 2024-01-14 DIAGNOSIS — L578 Other skin changes due to chronic exposure to nonionizing radiation: Secondary | ICD-10-CM

## 2024-01-14 DIAGNOSIS — L821 Other seborrheic keratosis: Secondary | ICD-10-CM

## 2024-01-14 DIAGNOSIS — D229 Melanocytic nevi, unspecified: Secondary | ICD-10-CM

## 2024-01-14 DIAGNOSIS — Z85828 Personal history of other malignant neoplasm of skin: Secondary | ICD-10-CM

## 2024-01-14 NOTE — Progress Notes (Signed)
 Follow-Up Visit   Subjective  Joshua Bass is a 73 y.o. male who presents for the following: Skin Cancer Screening and Full Body Skin Exam  The patient presents for Total-Body Skin Exam (TBSE) for skin cancer screening and mole check. The patient has spots, moles and lesions to be evaluated, some may be new or changing and the patient may have concern these could be cancer.  Hx BCC.  The following portions of the chart were reviewed this encounter and updated as appropriate: medications, allergies, medical history  Review of Systems:  No other skin or systemic complaints except as noted in HPI or Assessment and Plan.  Objective  Well appearing patient in no apparent distress; mood and affect are within normal limits.  A full examination was performed including scalp, head, eyes, ears, nose, lips, neck, chest, axillae, abdomen, back, buttocks, bilateral upper extremities, bilateral lower extremities, hands, feet, fingers, toes, fingernails, and toenails. All findings within normal limits unless otherwise noted below.   Relevant physical exam findings are noted in the Assessment and Plan.  R occipital scalp x 1, L parietal scalp at hair part x 1 (2) Erythematous thin papules/macules with gritty scale.   Assessment & Plan   SKIN CANCER SCREENING PERFORMED TODAY.  ACTINIC DAMAGE - Chronic condition, secondary to cumulative UV/sun exposure - diffuse scaly erythematous macules with underlying dyspigmentation - Recommend daily broad spectrum sunscreen SPF 30+ to sun-exposed areas, reapply every 2 hours as needed.  - Staying in the shade or wearing long sleeves, sun glasses (UVA+UVB protection) and wide brim hats (4-inch brim around the entire circumference of the hat) are also recommended for sun protection.  - Call for new or changing lesions.  LENTIGINES, SEBORRHEIC KERATOSES, HEMANGIOMAS - Benign normal skin lesions - Benign-appearing - Call for any changes  MELANOCYTIC  NEVI - Tan-brown and/or pink-flesh-colored symmetric macules and papules - Benign appearing on exam today - Observation - Call clinic for new or changing moles - Recommend daily use of broad spectrum spf 30+ sunscreen to sun-exposed areas.   HISTORY OF BASAL CELL CARCINOMA OF THE SKIN - left upper arm - No evidence of recurrence today - Recommend regular full body skin exams - Recommend daily broad spectrum sunscreen SPF 30+ to sun-exposed areas, reapply every 2 hours as needed.  - Call if any new or changing lesions are noted between office visits     AK (ACTINIC KERATOSIS) (2) R occipital scalp x 1, L parietal scalp at hair part x 1 (2) Actinic keratoses are precancerous spots that appear secondary to cumulative UV radiation exposure/sun exposure over time. They are chronic with expected duration over 1 year. A portion of actinic keratoses will progress to squamous cell carcinoma of the skin. It is not possible to reliably predict which spots will progress to skin cancer and so treatment is recommended to prevent development of skin cancer.  Recommend daily broad spectrum sunscreen SPF 30+ to sun-exposed areas, reapply every 2 hours as needed.  Recommend staying in the shade or wearing long sleeves, sun glasses (UVA+UVB protection) and wide brim hats (4-inch brim around the entire circumference of the hat). Call for new or changing lesions. Destruction of lesion - R occipital scalp x 1, L parietal scalp at hair part x 1 (2) Complexity: simple   Destruction method: cryotherapy   Informed consent: discussed and consent obtained   Timeout:  patient name, date of birth, surgical site, and procedure verified Lesion destroyed using liquid nitrogen: Yes  Region frozen until ice ball extended beyond lesion: Yes   Cryo cycles: 1 or 2. Outcome: patient tolerated procedure well with no complications   Post-procedure details: wound care instructions given    MULTIPLE BENIGN  NEVI   LENTIGINES   SEBORRHEIC KERATOSES   ACTINIC ELASTOSIS   CHERRY ANGIOMA   Return in about 6 months (around 07/14/2024) for TBSE, with Dr. Claudene, HxBCC, HxAK.  Joshua Bass, RMA, am acting as scribe for Boneta Claudene, MD .   Documentation: I have reviewed the above documentation for accuracy and completeness, and I agree with the above.  Boneta Claudene, MD

## 2024-01-14 NOTE — Patient Instructions (Signed)

## 2024-01-16 ENCOUNTER — Ambulatory Visit: Admitting: Dermatology

## 2024-02-17 ENCOUNTER — Ambulatory Visit: Admitting: Urology

## 2024-03-12 IMAGING — CT CT HEAD W/O CM
4 series · 16 of 47 positions shown, 18 images · non-contrast
Comparison: None Available.

CLINICAL DATA: Memory loss.



[Series 2: head wo · axial · 0.40mm/px · z∈[-82,+28]mm · 7 of 30 slices shown, 9 images]
[im 4/30  brain]
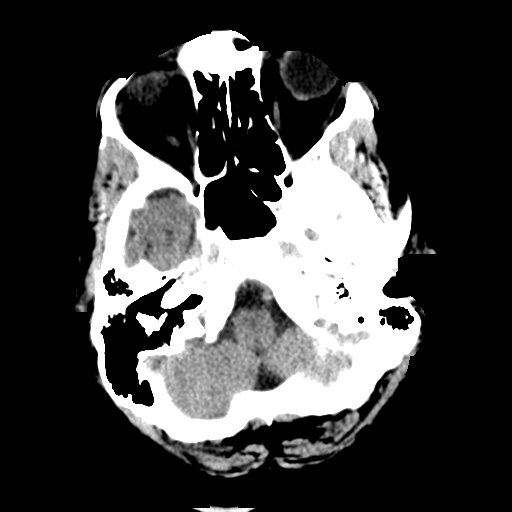
[im 4/30  bone]
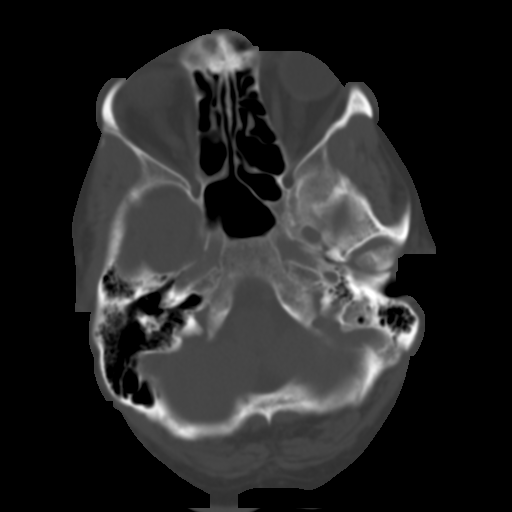
[im 8/30  brain]
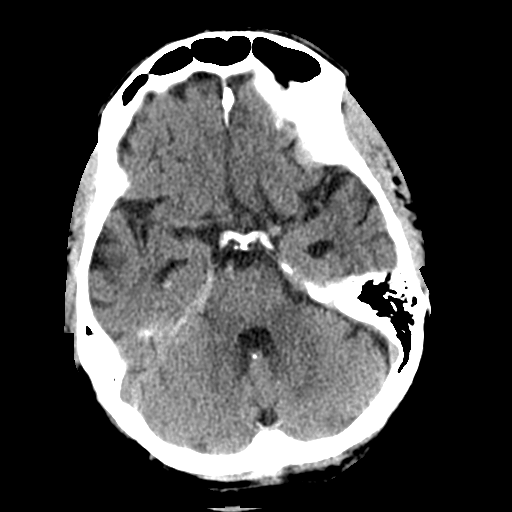
[im 11/30  brain]
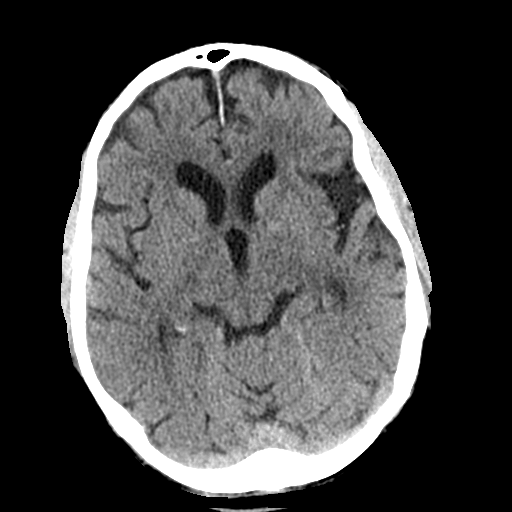
[im 15/30  brain]
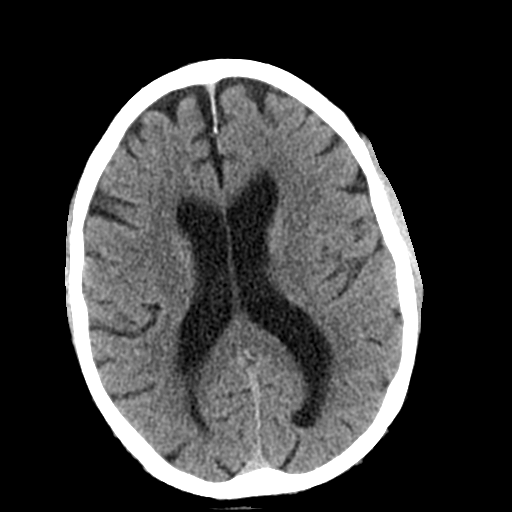
[im 19/30  brain]
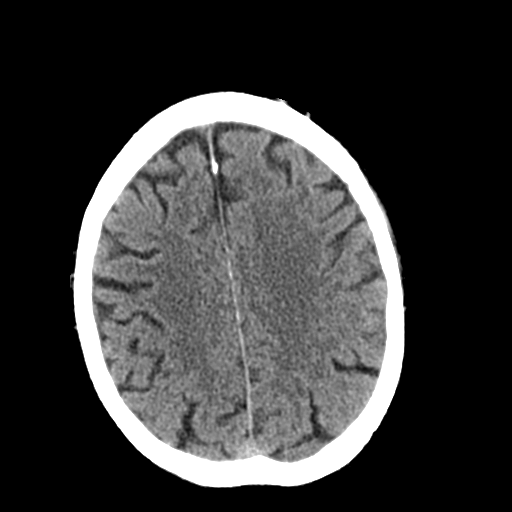
[im 19/30  bone]
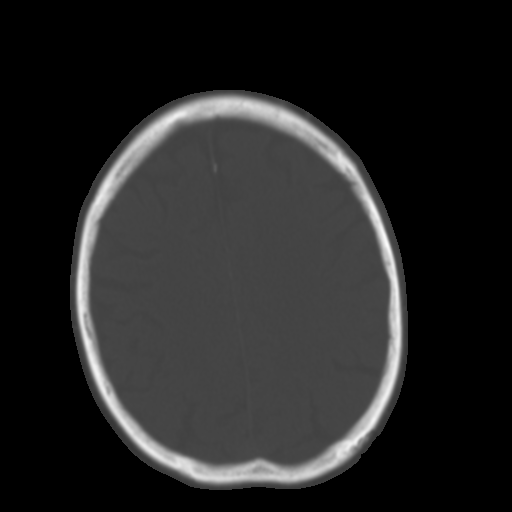
[im 22/30  brain]
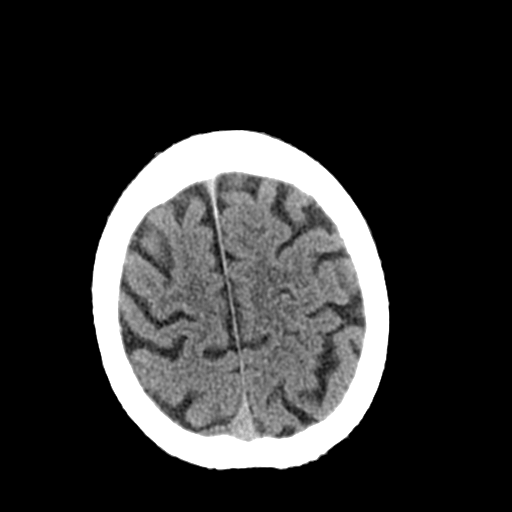
[im 26/30  brain]
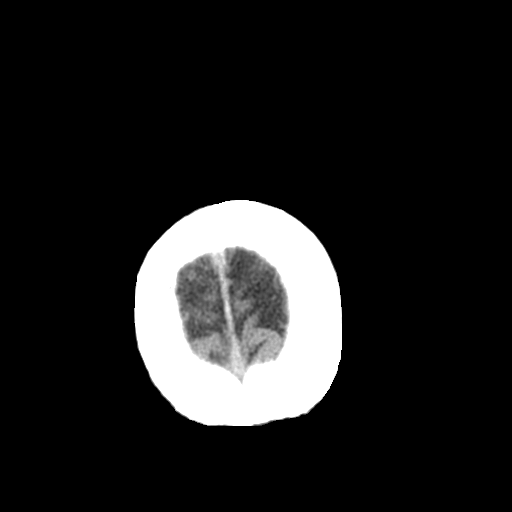

[Series 3: head bone · axial · 0.40mm/px · z∈[-83,-55]mm · 3 of 73 slices shown]
[im 8/73  bone]
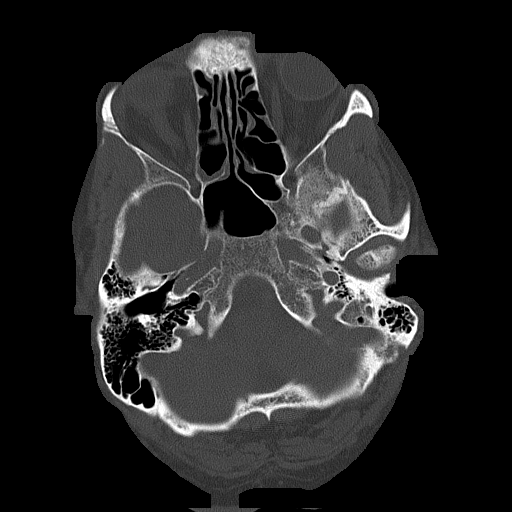
[im 15/73  bone]
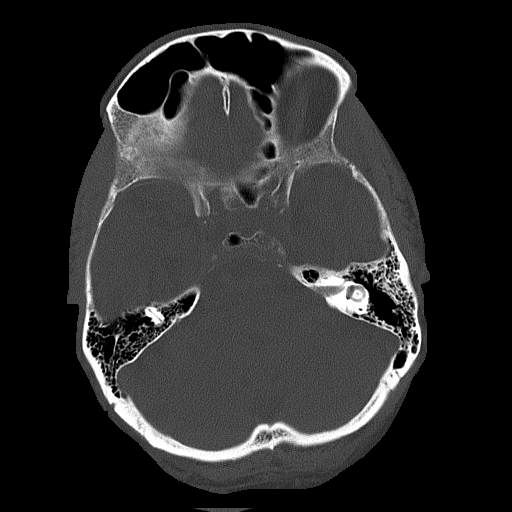
[im 22/73  bone]
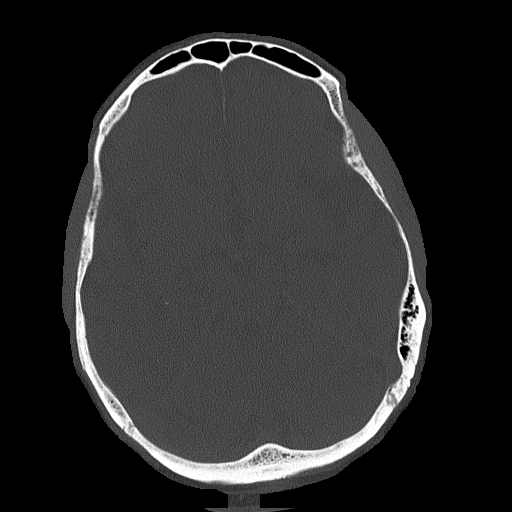

[Series 4: coronal soft tissue · coronal · 0.29mm/px · 3 of 67 slices shown]
[im 23/67  brain]
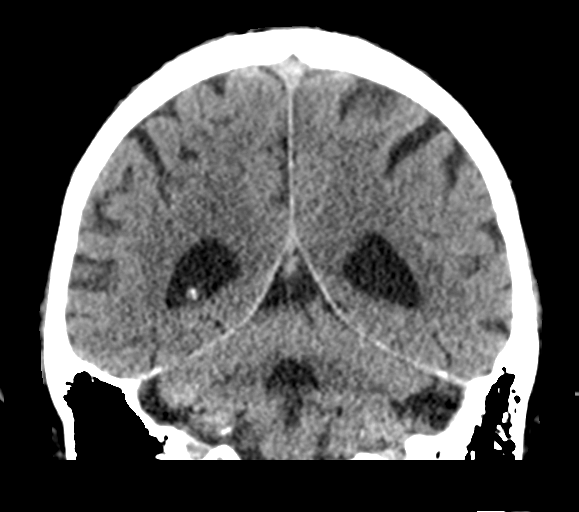
[im 30/67  brain]
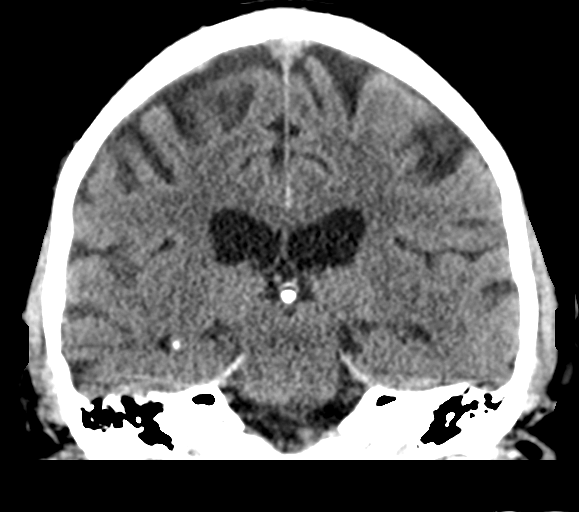
[im 37/67  brain]
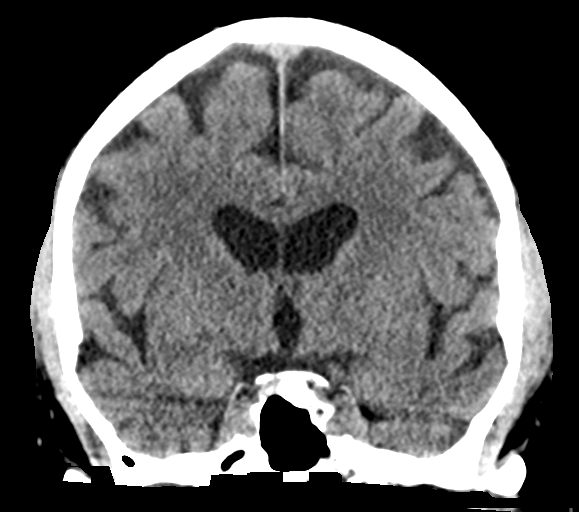

[Series 5: sagittal soft tissue · sagittal · 0.31mm/px · 3 of 61 slices shown]
[im 21/61  brain]
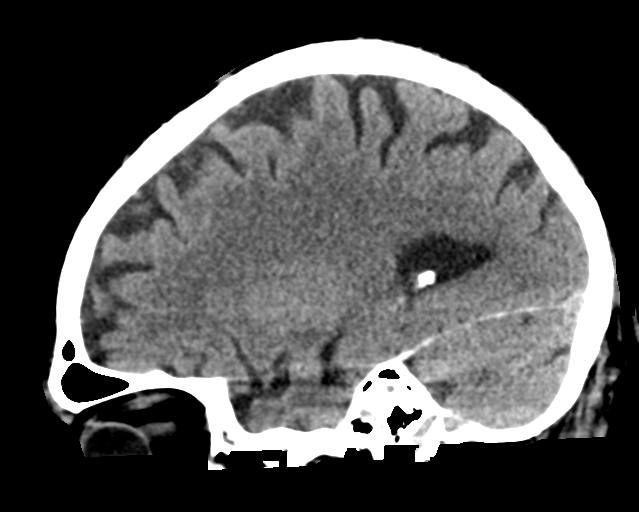
[im 31/61  brain]
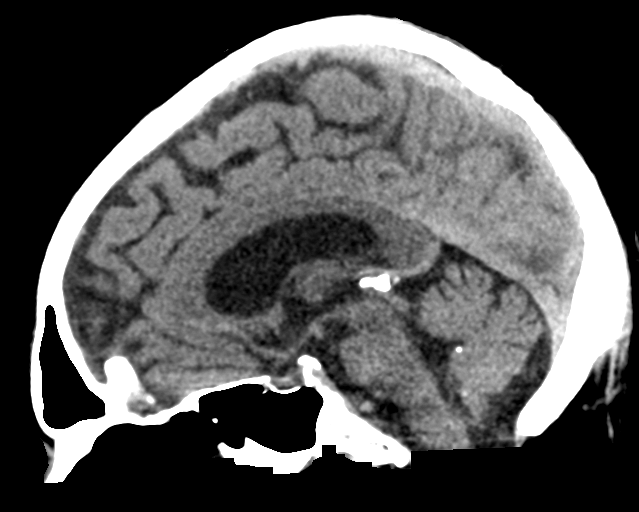
[im 41/61  brain]
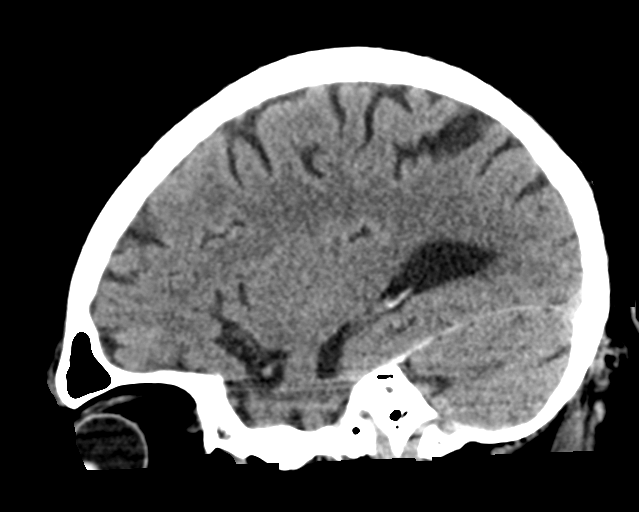

[16 of 47 positions shown; findings below may reference images not displayed]

FINDINGS: Brain: No acute infarct, hemorrhage, or mass lesion is present. Mild
atrophy and white matter changes are likely within normal limits for
age. Basal ganglia are intact. No focal cortical abnormalities are
present. The ventricles are of normal size. No significant
extraaxial fluid collection is present.

The brainstem and cerebellum are within normal limits.

Vascular: No hyperdense vessel or unexpected calcification.

Skull: Calvarium is intact. No focal lytic or blastic lesions are
present. No significant extracranial soft tissue lesion is present.

Sinuses/Orbits: The paranasal sinuses and mastoid air cells are
clear. The globes and orbits are within normal limits.
IMPRESSION: Negative CT of the head for age.

## 2024-03-23 ENCOUNTER — Encounter: Payer: Self-pay | Admitting: Urology

## 2024-03-23 ENCOUNTER — Other Ambulatory Visit: Admitting: Urology

## 2024-07-14 ENCOUNTER — Ambulatory Visit: Admitting: Dermatology
